# Patient Record
Sex: Female | Born: 1955 | Race: White | Hispanic: No | Smoking: Current every day smoker
Health system: Southern US, Community
[De-identification: ages and names within clinical notes are randomized; demographics above are authoritative.]

## PROBLEM LIST (undated history)

## (undated) DIAGNOSIS — R011 Cardiac murmur, unspecified: Secondary | ICD-10-CM

## (undated) DIAGNOSIS — M5020 Other cervical disc displacement, unspecified cervical region: Secondary | ICD-10-CM

## (undated) DIAGNOSIS — T7840XA Allergy, unspecified, initial encounter: Secondary | ICD-10-CM

## (undated) DIAGNOSIS — F329 Major depressive disorder, single episode, unspecified: Secondary | ICD-10-CM

## (undated) DIAGNOSIS — F32A Depression, unspecified: Secondary | ICD-10-CM

## (undated) DIAGNOSIS — M5412 Radiculopathy, cervical region: Secondary | ICD-10-CM

## (undated) DIAGNOSIS — F419 Anxiety disorder, unspecified: Secondary | ICD-10-CM

## (undated) DIAGNOSIS — R413 Other amnesia: Secondary | ICD-10-CM

## (undated) DIAGNOSIS — G90519 Complex regional pain syndrome I of unspecified upper limb: Secondary | ICD-10-CM

## (undated) HISTORY — PX: OTHER SURGICAL HISTORY: SHX169

## (undated) HISTORY — DX: Allergy, unspecified, initial encounter: T78.40XA

## (undated) HISTORY — DX: Radiculopathy, cervical region: M54.12

## (undated) HISTORY — DX: Cardiac murmur, unspecified: R01.1

## (undated) HISTORY — DX: Anxiety disorder, unspecified: F41.9

## (undated) HISTORY — PX: FRACTURE SURGERY: SHX138

## (undated) HISTORY — DX: Complex regional pain syndrome I of unspecified upper limb: G90.519

## (undated) HISTORY — PX: CARPAL TUNNEL RELEASE: SHX101

## (undated) HISTORY — DX: Major depressive disorder, single episode, unspecified: F32.9

## (undated) HISTORY — DX: Depression, unspecified: F32.A

## (undated) HISTORY — DX: Other amnesia: R41.3

---

## 1999-03-18 ENCOUNTER — Other Ambulatory Visit: Admission: RE | Admit: 1999-03-18 | Discharge: 1999-03-18 | Payer: Self-pay | Admitting: Obstetrics and Gynecology

## 1999-04-09 ENCOUNTER — Encounter: Payer: Self-pay | Admitting: Urology

## 1999-04-09 ENCOUNTER — Encounter (INDEPENDENT_AMBULATORY_CARE_PROVIDER_SITE_OTHER): Payer: Self-pay

## 1999-04-09 ENCOUNTER — Ambulatory Visit (HOSPITAL_COMMUNITY): Admission: RE | Admit: 1999-04-09 | Discharge: 1999-04-09 | Payer: Self-pay | Admitting: Urology

## 2000-04-06 ENCOUNTER — Other Ambulatory Visit: Admission: RE | Admit: 2000-04-06 | Discharge: 2000-04-06 | Payer: Self-pay | Admitting: Obstetrics and Gynecology

## 2001-04-26 ENCOUNTER — Other Ambulatory Visit: Admission: RE | Admit: 2001-04-26 | Discharge: 2001-04-26 | Payer: Self-pay | Admitting: Obstetrics and Gynecology

## 2001-11-01 ENCOUNTER — Encounter: Payer: Self-pay | Admitting: Family Medicine

## 2001-11-01 ENCOUNTER — Ambulatory Visit (HOSPITAL_COMMUNITY): Admission: RE | Admit: 2001-11-01 | Discharge: 2001-11-01 | Payer: Self-pay | Admitting: Family Medicine

## 2001-11-22 ENCOUNTER — Encounter: Payer: Self-pay | Admitting: Family Medicine

## 2001-11-22 ENCOUNTER — Ambulatory Visit (HOSPITAL_COMMUNITY): Admission: RE | Admit: 2001-11-22 | Discharge: 2001-11-22 | Payer: Self-pay | Admitting: Family Medicine

## 2001-12-08 ENCOUNTER — Other Ambulatory Visit: Admission: RE | Admit: 2001-12-08 | Discharge: 2001-12-08 | Payer: Self-pay | Admitting: Obstetrics and Gynecology

## 2002-04-27 ENCOUNTER — Other Ambulatory Visit: Admission: RE | Admit: 2002-04-27 | Discharge: 2002-04-27 | Payer: Self-pay | Admitting: Obstetrics and Gynecology

## 2002-11-03 ENCOUNTER — Other Ambulatory Visit: Admission: RE | Admit: 2002-11-03 | Discharge: 2002-11-03 | Payer: Self-pay | Admitting: Obstetrics and Gynecology

## 2003-05-03 ENCOUNTER — Other Ambulatory Visit: Admission: RE | Admit: 2003-05-03 | Discharge: 2003-05-03 | Payer: Self-pay | Admitting: Obstetrics and Gynecology

## 2003-05-10 ENCOUNTER — Encounter: Admission: RE | Admit: 2003-05-10 | Discharge: 2003-05-10 | Payer: Self-pay | Admitting: Obstetrics and Gynecology

## 2003-12-06 ENCOUNTER — Other Ambulatory Visit: Admission: RE | Admit: 2003-12-06 | Discharge: 2003-12-06 | Payer: Self-pay | Admitting: Obstetrics and Gynecology

## 2004-01-02 ENCOUNTER — Ambulatory Visit (HOSPITAL_COMMUNITY): Admission: RE | Admit: 2004-01-02 | Discharge: 2004-01-02 | Payer: Self-pay | Admitting: Family Medicine

## 2004-02-01 ENCOUNTER — Encounter: Admission: RE | Admit: 2004-02-01 | Discharge: 2004-02-01 | Payer: Self-pay | Admitting: Neurosurgery

## 2004-02-19 ENCOUNTER — Encounter: Admission: RE | Admit: 2004-02-19 | Discharge: 2004-02-19 | Payer: Self-pay | Admitting: Neurosurgery

## 2004-03-13 ENCOUNTER — Encounter (HOSPITAL_COMMUNITY): Admission: RE | Admit: 2004-03-13 | Discharge: 2004-04-12 | Payer: Self-pay | Admitting: Neurosurgery

## 2004-04-12 ENCOUNTER — Encounter (HOSPITAL_COMMUNITY): Admission: RE | Admit: 2004-04-12 | Discharge: 2004-05-12 | Payer: Self-pay | Admitting: Neurosurgery

## 2004-07-15 ENCOUNTER — Other Ambulatory Visit: Admission: RE | Admit: 2004-07-15 | Discharge: 2004-07-15 | Payer: Self-pay | Admitting: Obstetrics and Gynecology

## 2005-11-25 ENCOUNTER — Other Ambulatory Visit: Admission: RE | Admit: 2005-11-25 | Discharge: 2005-11-25 | Payer: Self-pay | Admitting: Obstetrics & Gynecology

## 2006-03-03 ENCOUNTER — Ambulatory Visit (HOSPITAL_BASED_OUTPATIENT_CLINIC_OR_DEPARTMENT_OTHER): Admission: RE | Admit: 2006-03-03 | Discharge: 2006-03-03 | Payer: Self-pay | Admitting: Urology

## 2006-12-17 ENCOUNTER — Other Ambulatory Visit: Admission: RE | Admit: 2006-12-17 | Discharge: 2006-12-17 | Payer: Self-pay | Admitting: Obstetrics and Gynecology

## 2007-03-29 ENCOUNTER — Emergency Department (HOSPITAL_COMMUNITY): Admission: EM | Admit: 2007-03-29 | Discharge: 2007-03-29 | Payer: Self-pay | Admitting: Emergency Medicine

## 2007-03-31 ENCOUNTER — Ambulatory Visit (HOSPITAL_BASED_OUTPATIENT_CLINIC_OR_DEPARTMENT_OTHER): Admission: RE | Admit: 2007-03-31 | Discharge: 2007-03-31 | Payer: Self-pay | Admitting: Orthopedic Surgery

## 2008-01-06 ENCOUNTER — Other Ambulatory Visit: Admission: RE | Admit: 2008-01-06 | Discharge: 2008-01-06 | Payer: Self-pay | Admitting: Obstetrics and Gynecology

## 2008-03-11 ENCOUNTER — Encounter: Admission: RE | Admit: 2008-03-11 | Discharge: 2008-03-11 | Payer: Self-pay | Admitting: Orthopedic Surgery

## 2008-03-20 ENCOUNTER — Encounter: Admission: RE | Admit: 2008-03-20 | Discharge: 2008-03-20 | Payer: Self-pay | Admitting: Obstetrics & Gynecology

## 2008-03-29 ENCOUNTER — Ambulatory Visit (HOSPITAL_COMMUNITY): Admission: RE | Admit: 2008-03-29 | Discharge: 2008-03-29 | Payer: Self-pay | Admitting: Orthopedic Surgery

## 2008-03-31 ENCOUNTER — Ambulatory Visit (HOSPITAL_COMMUNITY): Admission: RE | Admit: 2008-03-31 | Discharge: 2008-03-31 | Payer: Self-pay | Admitting: Orthopedic Surgery

## 2008-07-10 ENCOUNTER — Ambulatory Visit: Payer: Self-pay | Admitting: Pain Medicine

## 2009-03-23 ENCOUNTER — Encounter: Admission: RE | Admit: 2009-03-23 | Discharge: 2009-03-23 | Payer: Self-pay | Admitting: Obstetrics & Gynecology

## 2009-06-27 ENCOUNTER — Ambulatory Visit: Payer: Self-pay | Admitting: Psychology

## 2009-08-27 ENCOUNTER — Ambulatory Visit: Payer: Self-pay | Admitting: Psychology

## 2009-10-26 ENCOUNTER — Encounter: Admission: RE | Admit: 2009-10-26 | Discharge: 2009-10-26 | Payer: Self-pay | Admitting: Neurology

## 2009-11-19 ENCOUNTER — Encounter: Admission: RE | Admit: 2009-11-19 | Discharge: 2009-11-19 | Payer: Self-pay | Admitting: Neurology

## 2010-04-17 ENCOUNTER — Encounter
Admission: RE | Admit: 2010-04-17 | Discharge: 2010-04-17 | Payer: Self-pay | Source: Home / Self Care | Admitting: Obstetrics and Gynecology

## 2010-08-08 ENCOUNTER — Other Ambulatory Visit (HOSPITAL_COMMUNITY): Payer: Self-pay | Admitting: Neurology

## 2010-08-08 DIAGNOSIS — G90519 Complex regional pain syndrome I of unspecified upper limb: Secondary | ICD-10-CM

## 2010-08-16 ENCOUNTER — Encounter (HOSPITAL_COMMUNITY)
Admission: RE | Admit: 2010-08-16 | Discharge: 2010-08-16 | Disposition: A | Discharge status: Home / Self Care | Payer: 59 | Source: Ambulatory Visit | Attending: Neurology | Admitting: Neurology

## 2010-08-16 ENCOUNTER — Encounter (HOSPITAL_COMMUNITY): Payer: Self-pay

## 2010-08-16 DIAGNOSIS — G90519 Complex regional pain syndrome I of unspecified upper limb: Secondary | ICD-10-CM | POA: Insufficient documentation

## 2010-08-16 MED ORDER — TECHNETIUM TC 99M MEDRONATE IV KIT
23.8000 | PACK | Freq: Once | INTRAVENOUS | Status: AC | PRN
  Administered 2010-08-16: 23.8 via INTRAVENOUS

## 2010-09-24 NOTE — Consult Note (Signed)
NAME:  Jean Perez, Jean Perez                ACCOUNT NO.:  [id number]   MEDICAL RECORD NO.:  [id number]          PATIENT TYPE:  EMS   LOCATION:  ED                            FACILITY:  [facility]   PHYSICIAN:  J. Darreld Mclean, M.D. DATE OF BIRTH:  1956-01-24   DATE OF CONSULTATION:  DATE OF DISCHARGE:                                 CONSULTATION   REFERRING PHYSICIAN:  Patient seen at the request of Dr. Colon Branch.   The patient is a 55 year old female who slipped and fell on a step while  bringing her groceries up into her house.  She had an injury 11:30 at  night tonight.  She had immediate pain and deformity of her left arm and  elbow area.  She is brought to the emergency room.  X-rays reveal a  dislocation of the left elbow posteriorly, with what looked like  fragments off of the capitellum.  The patient is in pain.  She has  developed significant numbness to her fingers and some early  discoloration of the fingers, particularly on the ulnar side.  There are  no other injuries.   The patient has a history of cervical disk, and she says the numbness  and all feels like it did when she had her disk being active.  She has  no surgical history.  She is on no medications.  She has no allergies.  General health as very good.  The patient is accompanied by her  daughter.   The patient has received morphine 20 mg by the time I was called and  have arrived.  I explained the seriousness of the injury.  Because of  the fragment, she is going to need to be seen by the hand surgeon.  The  question is with reduction will the fragments enter the joint or not.  I  have explained that if they do, she will need surgery to have these  removed.  She will still need to see the hand surgeon because of the  fracture around the capitellum area.  The elbow, I explained, needed to  be reduced more as an emergency maneuver because of her marked  paresthesias and beginning some color changes to her hand.  She has  significant swelling also around the medial elbow area.  Pulses are  present.  There is no signs of a compartment syndrome by clinical exam  as best I can determine.  This is a concern, of course.   I explained the procedure and risks, and imponderables to the patient  and to her daughter.   It should be noted that the patient is left-hand dominant.  This is the  left elbow.   Consent was given for Versed and conscious sedation procedure for the  elbow.  They appeared to understand and agreed with the procedure.  Signed a form for the procedure.   The patient was given IV Versed 5 mg slowly by me.  After 4 mg she was  still talking.  After the 5th mg she became somnolent.  I did a general  closed reduction of the elbow.  It was not difficult to reduce.  Color  immediately returned properly back to the hand.  At the time an x-ray  was done, just a lateral view, the patient's color and temperature of  her hand returned to normal.  I had her move her fingers and she can  move them.  She was still somnolent from the Versed.  Initial reduction  looked good on the lateral.  I put a posterior splint on, and other  views are being taken now.  She will need to be seen by a hand surgeon  later today.  It is 2:45 in the morning, and  I have explained to the  patient's daughter that hand will need to see her concerning the  fracture of the capitellum and views, special views,  scan may need to be done.  The patient's sensation appears to be normal.  The patient's motion appears to be normal.  I told them if they had any  difficulty, come back to the emergency room later in the day.  Dr.  Colon Branch will make arrangements for the hand surgeon.  Will provide  outpatient pain medication.           ______________________________  Shela Commons. Darreld Mclean, M.D.     JWK/MEDQ  D:  03/29/2007  T:  03/29/2007  Job:  [id number]

## 2010-09-24 NOTE — Op Note (Signed)
NAME:  Jean Perez, Jean Perez                ACCOUNT NO.:  [id number]   MEDICAL RECORD NO.:  [id number]          PATIENT TYPE:  AMB   LOCATION:  [facility]                          FACILITY:  [facility]   PHYSICIAN:  Artist Pais. Weingold, M.D.DATE OF BIRTH:  01/27/1956   DATE OF PROCEDURE:  03/31/2007  DATE OF DISCHARGE:  03/31/2007                               OPERATIVE REPORT   PREOPERATIVE DIAGNOSIS:  Displaced fracture, left elbow, status post  dislocation.   POSTOPERATIVE DIAGNOSIS:  Displaced fracture, left elbow, status post  dislocation.   PROCEDURE:  Open reduction and internal fixation lateral condyle/capitellar  fracture, left elbow.   SURGEON:  Artist Pais. Mina Marble, M.D.  Cindee Salt, M.D.   ANESTHESIA:  General.   TOURNIQUET TIME:  50 minutes.   COMPLICATIONS:  None.   DRAINS:  None.   OPERATIVE REPORT:  The patient was taken to the operating suite. After  the induction of adequate general anesthesia, the left upper extremity  was prepped and draped in a sterile fashion.  An Esmarch was used to  exsanguinate the limb.  The tourniquet was inflated to 250 mmHg. At this  point in time, an incision was made, Kocher type, over the lateral  aspect of the elbow. The skin was incised for 5-6 cm.  The arm was  flexed 90 degrees.  Pronation/supination revealed crepitation of the  radiocapitellar joint.  The fascia overlying this area was incised and  dissection was carried down to the area over the radiocapitellar joint  where there was a rent in the common extensor tendon.  This was opened  proximally and distally revealing the radial head and capitellar  articulation. The radial head was intact.  There was a capitellar  fracture at the very lateral aspect which was complex in nature and  included a fair amount of the lateral condyle, as well.  The fracture  site was reduced and held with an 0.045 K-wire.  Once this was done, the  complex lateral condylar/capsular fracture was fixed with  three modular  handset 2.4 mm head screws, 18, 20, and 26 mm in length.  Intraoperative  fluoroscopy revealed adequate reduction in the AP, lateral, and oblique  views.  The wound was thoroughly irrigated and loosely closed in layers  of 0 Vicryl, 2-0 Vicryl and 3-0  Prolene subcuticular stitch on the skin.  Steri-Strips, 4x4s, fluffs and  posterior elbow splint was applied with the elbow flexed 90 degrees and  the forearm in neutral.  The patient tolerated the procedure well and  went to the recovery room in stable fashion.      Artist Pais Mina Marble, M.D.  Electronically Signed     MAW/MEDQ  D:  03/31/2007  T:  03/31/2007  Job:  [id number]

## 2010-09-27 NOTE — Op Note (Signed)
NAME:  Jean Perez, Jean Perez                ACCOUNT NO.:  [id number]   MEDICAL RECORD NO.:  [id number]          PATIENT TYPE:  AMB   LOCATION:  [facility]                         FACILITY:  [facility]   PHYSICIAN:  Jamison Neighbor, M.D.  DATE OF BIRTH:  [birth year]   DATE OF PROCEDURE:  03/03/2006  DATE OF DISCHARGE:                                 OPERATIVE REPORT   SERVICE:  Urology.   PREOPERATIVE DIAGNOSES:  1. Interstitial cystitis.  2. Urgency incontinence with associated voiding dysfunction.   POSTOPERATIVE DIAGNOSES:  1. Interstitial cystitis.  2. Urgency incontinence with associated voiding dysfunction.   PROCEDURE:  1. Cystoscopy.  2. Urethral calibration.  3. Hydrodistention of the bladder.  4. Marcaine and Pyridium installation.  5. Marcaine and Kenalog injection.   SURGEON:  Jamison Neighbor, M.D.   ANESTHESIA:  General.   COMPLICATIONS:  None.   DRAINS:  None.   BRIEF HISTORY:  This 55 year old female had a diagnosis of interstitial  cystitis made 6-7 years ago.  The patient had a cystoscopic examination at  that time that did not show significant glomerulations, but on biopsy, she  had significant mast cells in the bladder.  The patient had a great response  to hydrodistention and did well for 5 or more years.  Recently, however, she  has had increasing urgency and frequency as well as a problem with chronic  straining to move her bowels.  She also felt there was some degree of  incontinence.  Urodynamic study showed she had no evidence of stress  incontinence.  She did have some degree of urgency with hypersensitivity and  she also had some degree of elevated pelvic floor dysfunction.  The patient  is now to undergo cystoscopy and hydrodistention to reevaluate the bladder  and see if she can have the same kind of improvement she had 6 or more years  ago.  She is aware of the fact, however, that there is no guarantee she will  have the same improvement and she may need to go  back on medications which  could include Elmiron, antihistamines, or possibly muscle relaxants such as  Valium for her pelvic floor.  We also will consider her for Physical Therapy  evaluation.  She understands the risks and benefits of the procedure and  gave full informed consent.   PROCEDURE:  After successful induction of general anesthesia, the patient  was placed in the dorsal lithotomy position, prepped with Betadine and  draped in the usual sterile fashion.  Careful bimanual examination revealed  a normal urethra and an unremarkable bladder base.  There were no signs of a  cystocele or any vault prolapse.  There was no enterocele.  There did appear  to be a modest rectocele, but it was not felt this was a significant  finding.  The urethra was calibrated to 32-French with female urethral  sounds with no signs of stenosis or stricture.  The cystoscope was inserted  and the bladder was carefully inspected; it was free of any tumor or stones.  Both ureteral orifices were normal in configuration and location.  Hydrodistention of the bladder was then performed; the bladder was distended  at a pressure of 100 cmH2O for 5 minutes.  The bladder capacity has really  normalized; it is now at 1000 mL and very little in the way of  glomerulations could be identified; this is an improvement from what she had  the last time this was done and shows major improvement in the bladder.  A  biopsy was not performed.  There was an area of somewhat pronounced squamous  metaplasia that might be a source of hematuria, so that was cauterized, but  otherwise, nothing else needed to be done with the bladder.  The bladder was  drained and a mixture of Marcaine and Pyridium was left in the bladder.  A  mixture of Marcaine and Kenalog was injected periurethral.  The patient  tolerated the procedure well and was taken to the recovery room in good  condition.  She received intraoperative Toradol, Zofran and a  B&O  suppository.   She will return to the office in followup in 3 weeks' time.  She will be  sent home with Lorcet Plus, Pyridium Plus and doxycycline.  At that time, we  will assess results of the hydrodistention and decides whether she needs  interstitial cystitis-directed therapy, physical therapy or muscle relaxants  to try to improve her voiding parameters.           ______________________________  Jamison Neighbor, M.D.  Electronically Signed     RJE/MEDQ  D:  03/03/2006  T:  03/04/2006  Job:  [id number]   cc:   Edwena Felty. Romine, M.D.  Fax: [phone]

## 2010-09-27 NOTE — Procedures (Signed)
   NAME:  Jean Perez, Jean Perez NO.:  [id number]   MEDICAL RECORD NO.:  [id number]                  PATIENT TYPE:   LOCATION:                                       FACILITY:   PHYSICIAN:  Donna Bernard, M.D.             DATE OF BIRTH:   DATE OF PROCEDURE:  11/01/2001  DATE OF DISCHARGE:                                    STRESS TEST   INDICATIONS FOR TEST:  The patient is a 55 year old white female with a  history of atypical chest discomfort.  There is positive family history of  coronary artery disease.  The patient does not smoke.   TEST DATA:  Stress test was performed at standard Bruce protocol.  Resting  EKG revealed normal sinus rhythm with no significant ST-T changes.  The  patient tolerated the first two stages relatively well.  During the third  stage, the patient developed progressive tachypnea.  She had no chest pain  or discomfort.  At her maximum heart rate of 155, she surpassed her max  predicated heart rate of 150.  At 0.08 seconds past the J point, there were  no significant ST segment changes.  In some of the leads, the ST segment was  depressed; however, the slope was nicely ascending at 0.08 seconds past the  J point with less than 1 mm of actual depression except for in one single  lead in V4.   IMPRESSION:  Negative adequate stress test.   PLAN:  The patient reassured and encouraged to exercise.                                               Donna Bernard, M.D.    WSL/MEDQ  D:  04/23/2002  T:  04/24/2002  Job:  [id number]

## 2011-01-24 ENCOUNTER — Ambulatory Visit: Payer: 59 | Attending: Neurology | Admitting: Occupational Therapy

## 2011-01-24 DIAGNOSIS — IMO0001 Reserved for inherently not codable concepts without codable children: Secondary | ICD-10-CM | POA: Insufficient documentation

## 2011-01-24 DIAGNOSIS — R279 Unspecified lack of coordination: Secondary | ICD-10-CM | POA: Insufficient documentation

## 2011-01-24 DIAGNOSIS — M25649 Stiffness of unspecified hand, not elsewhere classified: Secondary | ICD-10-CM | POA: Insufficient documentation

## 2011-01-24 DIAGNOSIS — M2550 Pain in unspecified joint: Secondary | ICD-10-CM | POA: Insufficient documentation

## 2011-01-24 DIAGNOSIS — M6281 Muscle weakness (generalized): Secondary | ICD-10-CM | POA: Insufficient documentation

## 2011-01-27 ENCOUNTER — Ambulatory Visit: Payer: 59 | Admitting: Occupational Therapy

## 2011-01-30 ENCOUNTER — Ambulatory Visit: Payer: 59 | Admitting: Occupational Therapy

## 2011-02-03 ENCOUNTER — Ambulatory Visit: Payer: 59 | Admitting: Occupational Therapy

## 2011-02-06 ENCOUNTER — Ambulatory Visit: Payer: 59 | Admitting: Occupational Therapy

## 2011-02-10 ENCOUNTER — Ambulatory Visit: Payer: 59 | Attending: Neurology | Admitting: Occupational Therapy

## 2011-02-10 DIAGNOSIS — M6281 Muscle weakness (generalized): Secondary | ICD-10-CM | POA: Insufficient documentation

## 2011-02-10 DIAGNOSIS — IMO0001 Reserved for inherently not codable concepts without codable children: Secondary | ICD-10-CM | POA: Insufficient documentation

## 2011-02-10 DIAGNOSIS — M2550 Pain in unspecified joint: Secondary | ICD-10-CM | POA: Insufficient documentation

## 2011-02-10 DIAGNOSIS — M25649 Stiffness of unspecified hand, not elsewhere classified: Secondary | ICD-10-CM | POA: Insufficient documentation

## 2011-02-10 DIAGNOSIS — R279 Unspecified lack of coordination: Secondary | ICD-10-CM | POA: Insufficient documentation

## 2011-02-12 ENCOUNTER — Ambulatory Visit: Payer: 59 | Admitting: Occupational Therapy

## 2011-02-17 ENCOUNTER — Ambulatory Visit: Payer: 59 | Admitting: Occupational Therapy

## 2011-02-18 LAB — POCT HEMOGLOBIN-HEMACUE
Hemoglobin: 12.7
Operator id: 116011

## 2011-02-20 ENCOUNTER — Encounter: Payer: 59 | Admitting: Occupational Therapy

## 2011-03-19 ENCOUNTER — Other Ambulatory Visit: Payer: Self-pay | Admitting: Obstetrics and Gynecology

## 2011-03-19 DIAGNOSIS — Z1231 Encounter for screening mammogram for malignant neoplasm of breast: Secondary | ICD-10-CM

## 2011-04-21 ENCOUNTER — Ambulatory Visit
Admission: RE | Admit: 2011-04-21 | Discharge: 2011-04-21 | Disposition: A | Discharge status: Home / Self Care | Payer: 59 | Source: Ambulatory Visit | Attending: Obstetrics and Gynecology | Admitting: Obstetrics and Gynecology

## 2011-04-21 DIAGNOSIS — Z1231 Encounter for screening mammogram for malignant neoplasm of breast: Secondary | ICD-10-CM

## 2011-08-15 DIAGNOSIS — M5412 Radiculopathy, cervical region: Secondary | ICD-10-CM | POA: Diagnosis not present

## 2011-08-15 DIAGNOSIS — G5600 Carpal tunnel syndrome, unspecified upper limb: Secondary | ICD-10-CM | POA: Diagnosis not present

## 2011-08-15 DIAGNOSIS — R413 Other amnesia: Secondary | ICD-10-CM | POA: Diagnosis not present

## 2011-08-15 DIAGNOSIS — G90519 Complex regional pain syndrome I of unspecified upper limb: Secondary | ICD-10-CM | POA: Diagnosis not present

## 2011-08-15 DIAGNOSIS — F329 Major depressive disorder, single episode, unspecified: Secondary | ICD-10-CM | POA: Diagnosis not present

## 2011-12-16 DIAGNOSIS — G90519 Complex regional pain syndrome I of unspecified upper limb: Secondary | ICD-10-CM | POA: Diagnosis not present

## 2011-12-16 DIAGNOSIS — G5600 Carpal tunnel syndrome, unspecified upper limb: Secondary | ICD-10-CM | POA: Diagnosis not present

## 2012-01-07 DIAGNOSIS — G5600 Carpal tunnel syndrome, unspecified upper limb: Secondary | ICD-10-CM | POA: Diagnosis not present

## 2012-03-12 ENCOUNTER — Other Ambulatory Visit: Payer: Self-pay | Admitting: Obstetrics and Gynecology

## 2012-03-12 DIAGNOSIS — Z1231 Encounter for screening mammogram for malignant neoplasm of breast: Secondary | ICD-10-CM

## 2012-03-13 DIAGNOSIS — Z23 Encounter for immunization: Secondary | ICD-10-CM | POA: Diagnosis not present

## 2012-03-17 ENCOUNTER — Other Ambulatory Visit: Payer: Self-pay | Admitting: Nurse Practitioner

## 2012-03-17 DIAGNOSIS — E559 Vitamin D deficiency, unspecified: Secondary | ICD-10-CM | POA: Diagnosis not present

## 2012-03-17 DIAGNOSIS — Z01419 Encounter for gynecological examination (general) (routine) without abnormal findings: Secondary | ICD-10-CM | POA: Diagnosis not present

## 2012-03-17 DIAGNOSIS — Z124 Encounter for screening for malignant neoplasm of cervix: Secondary | ICD-10-CM | POA: Diagnosis not present

## 2012-03-17 DIAGNOSIS — Z0000 Encounter for general adult medical examination without abnormal findings: Secondary | ICD-10-CM | POA: Diagnosis not present

## 2012-03-17 DIAGNOSIS — E7800 Pure hypercholesterolemia, unspecified: Secondary | ICD-10-CM | POA: Diagnosis not present

## 2012-03-17 DIAGNOSIS — N951 Menopausal and female climacteric states: Secondary | ICD-10-CM

## 2012-03-24 DIAGNOSIS — E785 Hyperlipidemia, unspecified: Secondary | ICD-10-CM | POA: Diagnosis not present

## 2012-04-16 ENCOUNTER — Emergency Department (HOSPITAL_COMMUNITY): Payer: No Typology Code available for payment source

## 2012-04-16 ENCOUNTER — Emergency Department (HOSPITAL_COMMUNITY)
Admission: EM | Admit: 2012-04-16 | Discharge: 2012-04-16 | Disposition: A | Discharge status: Home / Self Care | Payer: No Typology Code available for payment source | Attending: Emergency Medicine | Admitting: Emergency Medicine

## 2012-04-16 ENCOUNTER — Encounter (HOSPITAL_COMMUNITY): Payer: Self-pay | Admitting: *Deleted

## 2012-04-16 DIAGNOSIS — M5020 Other cervical disc displacement, unspecified cervical region: Secondary | ICD-10-CM | POA: Insufficient documentation

## 2012-04-16 DIAGNOSIS — IMO0002 Reserved for concepts with insufficient information to code with codable children: Secondary | ICD-10-CM | POA: Diagnosis not present

## 2012-04-16 DIAGNOSIS — Z23 Encounter for immunization: Secondary | ICD-10-CM | POA: Insufficient documentation

## 2012-04-16 DIAGNOSIS — F17200 Nicotine dependence, unspecified, uncomplicated: Secondary | ICD-10-CM | POA: Insufficient documentation

## 2012-04-16 DIAGNOSIS — Z79899 Other long term (current) drug therapy: Secondary | ICD-10-CM | POA: Insufficient documentation

## 2012-04-16 DIAGNOSIS — S0081XA Abrasion of other part of head, initial encounter: Secondary | ICD-10-CM

## 2012-04-16 DIAGNOSIS — F411 Generalized anxiety disorder: Secondary | ICD-10-CM | POA: Diagnosis not present

## 2012-04-16 DIAGNOSIS — Y9389 Activity, other specified: Secondary | ICD-10-CM | POA: Insufficient documentation

## 2012-04-16 DIAGNOSIS — S20219A Contusion of unspecified front wall of thorax, initial encounter: Secondary | ICD-10-CM | POA: Insufficient documentation

## 2012-04-16 DIAGNOSIS — M5412 Radiculopathy, cervical region: Secondary | ICD-10-CM | POA: Diagnosis not present

## 2012-04-16 HISTORY — DX: Other cervical disc displacement, unspecified cervical region: M50.20

## 2012-04-16 MED ORDER — TETANUS-DIPHTH-ACELL PERTUSSIS 5-2.5-18.5 LF-MCG/0.5 IM SUSP
0.5000 mL | Freq: Once | INTRAMUSCULAR | Status: DC
  Filled 2012-04-16: qty 0.5

## 2012-04-16 MED ORDER — HYDROCODONE-ACETAMINOPHEN 5-500 MG PO TABS: 1.0000 | ORAL_TABLET | Freq: Four times a day (QID) | ORAL | Status: DC | PRN

## 2012-04-16 MED ORDER — HYDROCODONE-ACETAMINOPHEN 5-325 MG PO TABS
1.0000 | ORAL_TABLET | Freq: Once | ORAL | Status: AC
  Administered 2012-04-16: 1 via ORAL
  Filled 2012-04-16: qty 1

## 2012-04-16 NOTE — ED Notes (Signed)
Patient given discharge instructions, information, prescriptions, and diet order. Patient states that they adequately understand discharge information given and to return to ED if symptoms return or worsen.     
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
## 2012-04-16 NOTE — ED Notes (Signed)
Pt refused bloodwork - states "i feel fine and im ready to go home."  RN aware.
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
## 2012-04-26 ENCOUNTER — Ambulatory Visit: Payer: 59

## 2012-04-26 ENCOUNTER — Other Ambulatory Visit: Payer: 59

## 2012-10-06 ENCOUNTER — Telehealth: Payer: Self-pay | Admitting: Neurology

## 2012-10-13 ENCOUNTER — Telehealth: Payer: Self-pay | Admitting: Neurology

## 2012-10-13 NOTE — Telephone Encounter (Signed)
Patient will be assigned a doctor and called for a appointment.

## 2012-11-21 ENCOUNTER — Other Ambulatory Visit: Payer: Self-pay | Admitting: Neurology

## 2013-02-10 ENCOUNTER — Encounter: Payer: Self-pay | Admitting: Neurology

## 2013-02-10 ENCOUNTER — Ambulatory Visit (INDEPENDENT_AMBULATORY_CARE_PROVIDER_SITE_OTHER): Payer: Medicare Other | Admitting: Neurology

## 2013-02-10 VITALS — BP 115/80 | HR 73 | Temp 99.1°F | Ht 59.0 in | Wt 101.0 lb

## 2013-02-10 DIAGNOSIS — M79609 Pain in unspecified limb: Secondary | ICD-10-CM

## 2013-02-10 DIAGNOSIS — M79602 Pain in left arm: Secondary | ICD-10-CM

## 2013-02-10 MED ORDER — LIDOCAINE 5 % EX PTCH: 1.0000 | MEDICATED_PATCH | CUTANEOUS | Status: DC

## 2013-02-10 MED ORDER — GABAPENTIN 400 MG PO CAPS: 400.0000 mg | ORAL_CAPSULE | Freq: Two times a day (BID) | ORAL | Status: DC

## 2013-02-10 MED ORDER — CITALOPRAM HYDROBROMIDE 20 MG PO TABS: 20.0000 mg | ORAL_TABLET | Freq: Every day | ORAL | Status: DC

## 2013-02-10 MED ORDER — OXYCODONE-ACETAMINOPHEN 5-325 MG PO TABS: 1.0000 | ORAL_TABLET | ORAL | Status: DC | PRN

## 2013-02-10 NOTE — Progress Notes (Addendum)
[facility]  PATIENT: Jean Perez DOB: [birth year]  HISTORICAL  Jean Perez is a 57 years old left-handed Caucasian female, patient of Dr. Sandria Manly, last clinical visit was in December 2013, she is here to followup of her left arm complex regional pain syndrome  She suffered a fall, and injury to her left elbow in March 28 2007, had a surgery by Dr. Mina Marble in March 31 2007, complicated by pain in her left arm , she had 14  Stellate ganglion block for complex regional syndrome, also had left carpal tunnel release surgery in 2009, she subsequently had surgery at [facility] August 04 2008 with a left lateral condyle removal of the hardware, debridement of non-union, as well as the lateral collateral ligament reconstruction with allograft, and the left ulnar nerve decompression, despite all the effort, she continued to have left arm pain, from left elbow down, numbness tingling burning throbbing sensation, constant  She complains altered sensation in the left thumb index third and half of the fourth finger, cannot feel objects such as clothing from the dryer to determine if this is dry or wet  MRI study of the brain February 2011 showed mild atrophy Laboratory evaluation showed  normal B12, TSH, RPR, ESR   She had neuropsychiatric study in June 27 2009, August 24 2009, there was no cognitive deficit, it was felt that her anxiety, pain, and side effect of the pain medication causing her memory trouble  She is independent of daily living, driving a car,  EMG nerve conduction study in Sep 19 2009 showed changes of denervation, and reinnervation at left C7, MRI of the cervical in June 2011 showed mild left foraminal stenosis due to a small left posterior lateral disc protrusion at C6-7, and epidural steroid injection which increases stiffness in her neck,  She is now using compounding cream to her left arm, lidocaine patches, she also suffered depression and anxiety, is taking  gabapentin 400 mg twice a day, citalopram 20 mg once a day,  REVIEW OF SYSTEMS: Full 14 system review of systems performed and notable only for fatigue, swelling in legs, blurred vision, cough, constipation, urination problem, joint pain, joint swelling, achy muscles, energy, memory loss, confusion, headaches, numbness, weakness, dizziness, insomnia, sleepiness, depression, anxiety, not enough sleep, decreased energy, disinterested in activities, racing thoughts.  ALLERGIES: Allergies  Allergen Reactions  . Penicillins     HOME MEDICATIONS: Outpatient Prescriptions Prior to Visit  Medication Sig Dispense Refill  . calcium-vitamin D (OSCAL WITH D) 500-200 MG-UNIT per tablet Take 1 tablet by mouth daily.      . naproxen sodium (ALEVE) 220 MG tablet Take 220 mg by mouth 2 (two) times daily with a meal. PAIN      . PRESCRIPTION MEDICATION PT GETS A COMPOUND PRESCRIPTION AT CUSTOM CARE. KETAMINE 10% LIDOCAINE 5% AMITRIPALINE 2%      . Vitamin D, Ergocalciferol, (DRISDOL) 50000 UNITS CAPS Take 50,000 Units by mouth every 7 (seven) days.      . citalopram (CELEXA) 20 MG tablet TAKE 1 TABLET BY MOUTH DAILY  30 tablet  3  . gabapentin (NEURONTIN) 400 MG capsule TAKE 1 CAPSULE BY MOUTH TWICE DAILY  60 capsule  3  . HYDROcodone-acetaminophen (VICODIN) 5-500 MG per tablet Take 1-2 tablets by mouth every 6 (six) hours as needed for pain.  20 tablet  0  . lidocaine (LIDODERM) 5 % Place 1 patch onto the skin daily. Remove & Discard patch within 12 hours or as directed by MD  No facility-administered medications prior to visit.    PAST MEDICAL HISTORY: Past Medical History  Diagnosis Date  . Herniated cervical disc   . Memory loss   . Brachial neuritis or radiculitis NOS   . 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 Depression     PAST SURGICAL HISTORY: Past Surgical History  Procedure Laterality Date  . Arm surgery    . Left elbow    . Carpal tunnel release      FAMILY  HISTORY: Family History  Problem Relation Age of Onset  . Stroke    . Heart disease    . High blood pressure      SOCIAL HISTORY:  History   Social History  . Marital Status: Single    Spouse Name: N/A    Number of Children: 2  . Years of Education: HS   Occupational History  .      disabled   Social History Main Topics  . Smoking status: Current Every Day Smoker  . Smokeless tobacco: Never Used  . Alcohol Use: No  . Drug Use: No  . Sexual Activity: Not on file   Other Topics Concern  . Not on file   Social History Narrative   Patient is disabled. And she is divorced.    Caffeine one pot of coffee daily.   Left handed.     PHYSICAL EXAM   Filed Vitals:   02/10/13 1137  BP: 115/80  Pulse: 73  Temp: 99.1 F (37.3 C)  TempSrc: Oral  Height: 4\' 11"  (1.499 m)  Weight: 101 lb (45.813 kg)   Body mass index is 20.39 kg/(m^2).   Generalized: In no acute distress  Neck: Supple, no carotid bruits   Cardiac: Regular rate rhythm  Pulmonary: Clear to auscultation bilaterally  Musculoskeletal: No deformity  Neurological examination  Mentation: Alert oriented to time, place, history taking, and causual conversation  Cranial nerve II-XII: Pupils were equal round reactive to light extraocular movements were full, visual field were full on confrontational test. facial sensation and strength were normal. hearing was intact to finger rubbing bilaterally. Uvula tongue midline.  head turning and shoulder shrug and were normal and symmetric.Tongue protrusion into cheek strength was normal.  Motor: much less spontaneous movement of the left arm, there was a well-healed left elbow scar, no significant weakness, she has skin hypersensitivity at left elbow, left forearm there was no significant weakness,    Sensory: Intact to pinprick, preserved vibratory sensation, and proprioception at toes.  Coordination: Normal finger to nose, heel-to-shin bilaterally there was no  truncal ataxia  Gait: Rising up from seated position without assistance, normal stance, without trunk ataxia, moderate stride, good arm swing, smooth turning, able to perform tiptoe, and heel walking without difficulty.   Romberg signs: Negative  Deep tendon reflexes: Brachioradialis 2/2, biceps 2/2, triceps 2/2, patellar 2/2, Achilles 2/2, plantar responses were flexor bilaterally.   DIAGNOSTIC DATA (LABS, IMAGING, TESTING) - I reviewed patient records, labs, notes, testing and imaging myself where available.  Lab Results  Component Value Date   HGB 12.7 03/31/2007    ASSESSMENT AND PLAN   57 years old left-handed Caucasian female, with past medical history of left elbow surgery, developed complex regional pain syndrome,   1 I have refilled her medications, including Celexa 20 mg every day, gabapentin 400 mg twice a day, hydrocodone, naproxen 2. return to clinic in one year,     Levert Feinstein, M.D. Ph.D.  [facility]  [address], [place], [place] [address] [phone]

## 2013-02-23 DIAGNOSIS — Z23 Encounter for immunization: Secondary | ICD-10-CM | POA: Diagnosis not present

## 2013-03-18 ENCOUNTER — Ambulatory Visit: Payer: Self-pay | Admitting: Nurse Practitioner

## 2013-04-03 ENCOUNTER — Other Ambulatory Visit: Payer: Self-pay | Admitting: Neurology

## 2014-02-10 ENCOUNTER — Other Ambulatory Visit: Payer: Self-pay | Admitting: Neurology

## 2014-02-10 ENCOUNTER — Encounter (INDEPENDENT_AMBULATORY_CARE_PROVIDER_SITE_OTHER): Payer: Self-pay

## 2014-02-10 ENCOUNTER — Ambulatory Visit (INDEPENDENT_AMBULATORY_CARE_PROVIDER_SITE_OTHER): Payer: Medicare Other | Admitting: Neurology

## 2014-02-10 ENCOUNTER — Encounter: Payer: Self-pay | Admitting: Neurology

## 2014-02-10 VITALS — BP 111/78 | HR 70 | Ht 59.0 in | Wt 97.0 lb

## 2014-02-10 DIAGNOSIS — M79602 Pain in left arm: Secondary | ICD-10-CM

## 2014-02-10 MED ORDER — OXYCODONE-ACETAMINOPHEN 5-325 MG PO TABS: 1.0000 | ORAL_TABLET | ORAL | Status: DC | PRN

## 2014-02-10 MED ORDER — GABAPENTIN 400 MG PO CAPS: 400.0000 mg | ORAL_CAPSULE | Freq: Two times a day (BID) | ORAL | Status: DC

## 2014-02-10 MED ORDER — CITALOPRAM HYDROBROMIDE 20 MG PO TABS: 20.0000 mg | ORAL_TABLET | Freq: Every day | ORAL | Status: DC

## 2014-02-10 MED ORDER — LIDOCAINE 5 % EX PTCH: 1.0000 | MEDICATED_PATCH | CUTANEOUS | Status: DC

## 2014-02-10 NOTE — Progress Notes (Addendum)
[facility]  PATIENT: Jean Perez DOB: [birth year]  HISTORICAL  Jean Perez is a 58 years old left-handed Caucasian female, patient of Dr. Erling Cruz, last clinical visit was in December 2013, she is here to followup of her left arm complex regional pain syndrome  She suffered a fall, and injury to her left elbow in March 28 2007, had a surgery by Dr. Burney Gauze in March 30 1517, complicated by pain in her left arm , she had 14  Stellate ganglion block for complex regional syndrome, also had left carpal tunnel release surgery in 2009, she subsequently had surgery at [facility] in August 04 2008 with a left lateral condyle removal of the hardware, debridement of non-union, as well as the lateral collateral ligament reconstruction with allograft, and the left ulnar nerve decompression, despite all the effort, she continued to have left arm pain, from left elbow down, numbness tingling burning throbbing sensation, constant  She complains altered sensation in the left thumb index third and half of the fourth finger, cannot feel objects such as clothing from the dryer to determine if this is dry or wet  MRI study of the brain February 2011 showed mild atrophy Laboratory evaluation showed  normal B12, TSH, RPR, ESR   She had neuropsychiatric study in June 27 2009, August 24 2009, there was no cognitive deficit, it was felt that her anxiety, pain, and side effect of the pain medication causing her memory trouble  She is independent of daily living, driving a car,  EMG nerve conduction study in Sep 19 2009 showed changes of denervation, and reinnervation at left C7  MRI of the cervical in June 2011 showed mild left foraminal stenosis due to a small left posterior lateral disc protrusion at C6-7, and epidural steroid injection which increases stiffness in her neck,  She is now using compounding cream to her left arm, lidocaine patches, she also suffered depression and anxiety, is taking  gabapentin 400 mg twice a day, citalopram 20 mg once a day,  UPDATE Oct 2nd 2015: She is now on disability due to her left elbow injury, she denies gait difficulty, continuing to complains of left elbow discomfort, paresthesia, also frequent left shoulder blade area deep achy pain,  REVIEW OF SYSTEMS: Full 14 system review of systems performed and notable only for fatigue, left shoulder pain, eye pain, blurry vision, constipation, insomnia, frequent awakening, daytime sleepiness, frequent urination, joint pain, swelling, achy muscles, neck pain, stiffness, memory loss, headaches, weakness, confusion, decreased concentration, depression, anxiety  ALLERGIES: Allergies  Allergen Reactions  . Penicillins Hives, Other (See Comments) and Rash    Other Reaction: BRONCHOSPASM    HOME MEDICATIONS: Outpatient Prescriptions Prior to Visit  Medication Sig Dispense Refill  . calcium-vitamin D (OSCAL WITH D) 500-200 MG-UNIT per tablet Take 1 tablet by mouth daily.      . citalopram (CELEXA) 20 MG tablet TAKE 1 TABLET BY MOUTH EVERY DAY  30 tablet  0  . gabapentin (NEURONTIN) 400 MG capsule TAKE 1 CAPSULE BY MOUTH TWICE DAILY  60 capsule  0  . lidocaine (LIDODERM) 5 % Place 1 patch onto the skin daily. Remove & Discard patch within 12 hours or as directed by MD  30 patch  12  . naproxen sodium (ALEVE) 220 MG tablet Take 220 mg by mouth 2 (two) times daily with a meal. PAIN      . oxyCODONE-acetaminophen (PERCOCET/ROXICET) 5-325 MG per tablet Take 1 tablet by mouth every 4 (four) hours as needed  for pain.  30 tablet  0  . PRESCRIPTION MEDICATION PT GETS A COMPOUND PRESCRIPTION AT CUSTOM CARE. KETAMINE 10% LIDOCAINE 5% AMITRIPALINE 2%      . Vitamin D, Ergocalciferol, (DRISDOL) 50000 UNITS CAPS Take 50,000 Units by mouth every 7 (seven) days.       No facility-administered medications prior to visit.    PAST MEDICAL HISTORY: Past Medical History  Diagnosis Date  . Herniated cervical disc   . Memory  loss   . Brachial neuritis or radiculitis NOS   . 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 Depression     PAST SURGICAL HISTORY: Past Surgical History  Procedure Laterality Date  . Arm surgery    . Left elbow    . Carpal tunnel release      FAMILY HISTORY: Family History  Problem Relation Age of Onset  . Stroke    . Heart disease    . High blood pressure      SOCIAL HISTORY:  History   Social History  . Marital Status: Single    Spouse Name: N/A    Number of Children: 2  . Years of Education: HS   Occupational History  .      disabled   Social History Main Topics  . Smoking status: Current Every Day Smoker  . Smokeless tobacco: Never Used  . Alcohol Use: No  . Drug Use: No  . Sexual Activity: Not on file   Other Topics Concern  . Not on file   Social History Narrative   Patient is disabled. And she is divorced.    Caffeine one pot of coffee daily.   Left handed.     PHYSICAL EXAM   Filed Vitals:   02/10/14 1128  BP: 111/78  Pulse: 70  Height: _0  (1.499 m)  Weight: 97 lb (43.999 kg)   Body mass index is 19.58 kg/(m^2).   Generalized: In no acute distress  Neck: Supple, no carotid bruits   Cardiac: Regular rate rhythm  Pulmonary: Clear to auscultation bilaterally  Musculoskeletal: No deformity  Neurological examination  Mentation: Alert oriented to time, place, history taking, and causual conversation  Cranial nerve II-XII: Pupils were equal round reactive to light extraocular movements were full, visual field were full on confrontational test. facial sensation and strength were normal. hearing was intact to finger rubbing bilaterally. Uvula tongue midline.  head turning and shoulder shrug and were normal and symmetric.Tongue protrusion into cheek strength was normal.  Motor: much less spontaneous movement of the left arm, there was a well-healed left elbow scar, no significant weakness, she has skin hypersensitivity at left  elbow, left median forearm, tenderness of left levator scapular  Sensory: Intact to pinprick, preserved vibratory sensation, and proprioception at toes.  Coordination: Normal finger to nose, heel-to-shin bilaterally there was no truncal ataxia  Gait: Rising up from seated position without assistance, normal stance, without trunk ataxia, moderate stride, good arm swing, smooth turning, able to perform tiptoe, and heel walking without difficulty.   Romberg signs: Negative  Deep tendon reflexes: Brachioradialis 2/2, biceps 2/2, triceps 2/2, patellar 2/2, Achilles 2/2, plantar responses were flexor bilaterally.   DIAGNOSTIC DATA (LABS, IMAGING, TESTING) - I reviewed patient records, labs, notes, testing and imaging myself where available.  Lab Results  Component Value Date   HGB 12.7 03/31/2007    ASSESSMENT AND PLAN   58 years old left-handed Caucasian female, with past medical history of left elbow surgery, developed complex regional  pain syndrome,   1 I have refilled her medications, including Celexa 20 mg every day, gabapentin 400 mg twice a day, hydrocodone when necessary, lidocaine patch,  2. return to clinic in one year with nurse practitioner    Marcial Pacas, M.D. Ph.D.  [facility] [address] [place], [place] [address] [phone]

## 2014-02-16 ENCOUNTER — Telehealth: Payer: Self-pay

## 2014-02-16 NOTE — Telephone Encounter (Signed)
[employer] notified us they have approved our request for coverage on Lidocaine Patches effective until 08/12/2014 Ref # [id number]

## 2014-05-17 ENCOUNTER — Telehealth: Payer: Self-pay | Admitting: *Deleted

## 2014-05-17 NOTE — Telephone Encounter (Signed)
Patient states she had sternum chest pain. It comes and goes since Christmas Eve. She has been under a lot of stress. There is no rhyme or rhythm to the pain. No SOB. Pt said it helps ease off when she presses a pillow against her chest. Per Dr. Brett CanalesSteve, we will see her tomorrow. I told her if the pain gets worst, SOB, or numbness or tingling, to go to ER. Pt verbalized understanding.

## 2014-05-18 ENCOUNTER — Ambulatory Visit (INDEPENDENT_AMBULATORY_CARE_PROVIDER_SITE_OTHER): Payer: Medicare Other | Admitting: Family Medicine

## 2014-05-18 ENCOUNTER — Encounter: Payer: Self-pay | Admitting: Family Medicine

## 2014-05-18 VITALS — BP 120/72 | Wt 99.7 lb

## 2014-05-18 DIAGNOSIS — R079 Chest pain, unspecified: Secondary | ICD-10-CM | POA: Diagnosis not present

## 2014-05-18 MED ORDER — ETODOLAC 400 MG PO TABS: 400.0000 mg | ORAL_TABLET | Freq: Two times a day (BID) | ORAL | Status: DC

## 2014-05-18 NOTE — Progress Notes (Signed)
   Subjective:    Patient ID: Jean Perez, female    DOB: 01/26/1956, 59 y.o.   MRN: [id number]  HPI Patient arrives with complaint of spells of chest pain that comes and goes feels better if she presses a pillow against it. Patient also states she has been under a lot of stress lately and it started when she over ate at christmas. Sometimes it hurts for her to take deep breaths.  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 Followed by neurologist for this.  Pain not associated with exertion or exercise.  Review of Systems No vomiting no diarrhea no headache no back pain. No abdominal symptoms.    Objective:   Physical Exam  Alert active no acute distress. HEENT normal. Lungs clear. Heart regular in rhythm. Abdominal exam benign. Distinct sternal tenderness to palpation lower sternum and xiphoid process region.  EKG normal sinus rhythm no significant ST-T changes    Assessment & Plan:  Impression chest wall pain discussed at great length. Does not need GI/pulmonary/cardiology workup at this time. Inflammatory pain nature discussed. Plan trial of anti-inflammatories. Local measures discussed. Recheck her persists. WSL

## 2014-05-29 ENCOUNTER — Encounter: Payer: Self-pay | Admitting: Family Medicine

## 2014-06-01 ENCOUNTER — Ambulatory Visit: Payer: Medicare Other | Admitting: Family Medicine

## 2014-09-27 ENCOUNTER — Telehealth: Payer: Self-pay | Admitting: *Deleted

## 2014-09-27 NOTE — Telephone Encounter (Signed)
Left voicemail message for patient to call the office

## 2014-09-27 NOTE — Telephone Encounter (Signed)
Spoke to pt on the phone she has a question about her medical records.

## 2014-09-27 NOTE — Telephone Encounter (Signed)
Called patient about the requested amendment to her medical records.  Left a message for her to call back to discuss.

## 2014-09-28 NOTE — Telephone Encounter (Signed)
She needs her records changed from being right-handed to left-handed.

## 2014-11-30 ENCOUNTER — Ambulatory Visit (INDEPENDENT_AMBULATORY_CARE_PROVIDER_SITE_OTHER): Payer: Medicare Other | Admitting: Family Medicine

## 2014-11-30 ENCOUNTER — Encounter: Payer: Self-pay | Admitting: Family Medicine

## 2014-11-30 VITALS — Wt 95.8 lb

## 2014-11-30 DIAGNOSIS — T148 Other injury of unspecified body region: Secondary | ICD-10-CM | POA: Diagnosis not present

## 2014-11-30 DIAGNOSIS — W57XXXA Bitten or stung by nonvenomous insect and other nonvenomous arthropods, initial encounter: Secondary | ICD-10-CM

## 2014-11-30 MED ORDER — HYDROXYZINE HCL 25 MG PO TABS: 25.0000 mg | ORAL_TABLET | ORAL | Status: DC | PRN

## 2014-11-30 MED ORDER — PREDNISONE 20 MG PO TABS: ORAL_TABLET | ORAL | Status: DC

## 2014-11-30 MED ORDER — METHYLPREDNISOLONE ACETATE 40 MG/ML IJ SUSP
40.0000 mg | Freq: Once | INTRAMUSCULAR | Status: AC
  Administered 2014-11-30: 40 mg via INTRAMUSCULAR

## 2014-11-30 NOTE — Progress Notes (Signed)
   Subjective:    Patient ID: Jean Perez, female    DOB: November 25, 1955, 58 y.o.   MRN: [id number]  HPI Patient with extensive rash of the abdomen groin but not in chest. This is getting worse itching intensely started this past weekend no new medicines or soaps or detergents. PMH benign.   Review of Systems     Objective:   Physical Exam  Multiple bumps on abdomen chest lower groin all of these have the appearance of multiple bug bites.      Assessment & Plan:  Significant bug bites I recommend prednisone taper along with sterile shot warning signs were discussed. I believe these were due to straw mites

## 2014-11-30 NOTE — Progress Notes (Signed)
   Subjective:    Patient ID: Jean Perez, female    DOB: 1956-02-11, 59 y.o.   MRN: [id number]  HPI Patient arrives with c/o itchy rash since sat.   Review of Systems     Objective:   Physical Exam        Assessment & Plan:

## 2015-02-13 ENCOUNTER — Ambulatory Visit (INDEPENDENT_AMBULATORY_CARE_PROVIDER_SITE_OTHER): Payer: Medicare Other | Admitting: Nurse Practitioner

## 2015-02-13 ENCOUNTER — Encounter: Payer: Self-pay | Admitting: Nurse Practitioner

## 2015-02-13 VITALS — BP 113/78 | HR 77 | Ht 59.0 in | Wt 98.8 lb

## 2015-02-13 DIAGNOSIS — M79602 Pain in left arm: Secondary | ICD-10-CM

## 2015-02-13 MED ORDER — GABAPENTIN 400 MG PO CAPS: 400.0000 mg | ORAL_CAPSULE | Freq: Two times a day (BID) | ORAL | Status: DC

## 2015-02-13 MED ORDER — CITALOPRAM HYDROBROMIDE 20 MG PO TABS: 20.0000 mg | ORAL_TABLET | Freq: Every day | ORAL | Status: DC

## 2015-02-13 NOTE — Patient Instructions (Signed)
Patient to continue Celexa at current dose will refill Continue gabapentin at current dose will refill Follow-up yearly and when necessary

## 2015-02-13 NOTE — Progress Notes (Signed)
I have reviewed and agreed above plan. 

## 2015-02-13 NOTE — Progress Notes (Signed)
[facility]  PATIENT: Jean Perez DOB: 1955/05/15   REASON FOR VISIT: Follow-up for left elbow pain from previous injury and surgery  HISTORY FROM:Patient    HISTORY OF PRESENT ILLNESS: [name], 59 year old female returns for follow-up. She was last seen in this office by Dr. Krista Blue 02/10/2014. She has a history of left elbow injury and surgery. She is  on disability due to her left elbow injury, she denies gait difficulty, continuing to complains of left elbow discomfort, paresthesia, also frequent left shoulder blade area deep achy pain, her pain for the most part is controlled with gabapentin however she does take an occasional Percocet. She also occasionally wears an elbow brace. She returns for reevaluation    HISTORY: [name] is a 59 years old left-handed Caucasian female, patient of Dr. Erling Cruz, last clinical visit was in December 2013, she is here to followup of her left arm complex regional pain syndrome  She suffered a fall, and injury to her left elbow in March 28 2007, had a surgery by Dr. Burney Gauze in March 30 8526, complicated by pain in her left arm , she had 14 Stellate ganglion block for complex regional syndrome, also had left carpal tunnel release surgery in 2009, she subsequently had surgery at [facility] in August 04 2008 with a left lateral condyle removal of the hardware, debridement of non-union, as well as the lateral collateral ligament reconstruction with allograft, and the left ulnar nerve decompression, despite all the effort, she continued to have left arm pain, from left elbow down, numbness tingling burning throbbing sensation, constant She complains altered sensation in the left thumb index third and half of the fourth finger, cannot feel objects such as clothing from the dryer to determine if this is dry or wet MRI study of the brain February 2011 showed mild atrophy Laboratory evaluation showed normal B12, TSH, RPR, ESR  She had  neuropsychiatric study in June 27 2009, August 24 2009, there was no cognitive deficit, it was felt that her anxiety, pain, and side effect of the pain medication causing her memory trouble She is independent of daily living, driving a car, EMG nerve conduction study in Sep 19 2009 showed changes of denervation, and reinnervation at left C7 MRI of the cervical in June 2011 showed mild left foraminal stenosis due to a small left posterior lateral disc protrusion at C6-7, and epidural steroid injection which increases stiffness in her neck, She is now using compounding cream to her left arm, lidocaine patches, she also suffered depression and anxiety, is taking gabapentin 400 mg twice a day, citalopram 20 mg once a day,   REVIEW OF SYSTEMS: Full 14 system review of systems performed and notable only for those listed, all others are neg:  Constitutional:Fatigue  Cardiovascular: neg Ear/Nose/Throat: neg  Skin: neg Eyes:Blurred vision  Respiratory: neg Gastroitestinal:Constipation , urinary urgency  Hematology/Lymphatic: neg  Endocrine:Heat and cold intolerance  Musculoskeletal:neg Allergy/Immunology: neg Neurological:Memory loss, numbness in the left elbow and forearm  Psychiatric:Depression and anxiety  Sleep : neg   ALLERGIES: Allergies  Allergen Reactions  . Penicillins Hives, Other (See Comments) and Rash    Other Reaction: BRONCHOSPASM    HOME MEDICATIONS: Outpatient Prescriptions Prior to Visit  Medication Sig Dispense Refill  . calcium-vitamin D (OSCAL WITH D) 500-200 MG-UNIT per tablet Take 1 tablet by mouth daily.    . citalopram (CELEXA) 20 MG tablet Take 1 tablet (20 mg total) by mouth daily. 30 tablet 11  . etodolac (LODINE) 400  MG tablet Take 1 tablet (400 mg total) by mouth 2 (two) times daily. With food as needed 28 tablet 0  . gabapentin (NEURONTIN) 400 MG capsule Take 1 capsule (400 mg total) by mouth 2 (two) times daily. 60 capsule 11  . hydrOXYzine  (ATARAX/VISTARIL) 25 MG tablet Take 1 tablet (25 mg total) by mouth every 4 (four) hours as needed. 30 tablet 1  . lidocaine (LIDODERM) 5 % Place 1 patch onto the skin daily. Remove & Discard patch within 12 hours or as directed by MD 30 patch 11  . naproxen sodium (ALEVE) 220 MG tablet Take 220 mg by mouth 2 (two) times daily with a meal. PAIN    . oxyCODONE-acetaminophen (PERCOCET/ROXICET) 5-325 MG per tablet Take 1 tablet by mouth every 4 (four) hours as needed. 60 tablet 0  . predniSONE (DELTASONE) 20 MG tablet 3qd for 3d then 2qd for 3d then 1qd for 3d 18 tablet 0  . PRESCRIPTION MEDICATION PT GETS A COMPOUND PRESCRIPTION AT CUSTOM CARE. KETAMINE 10% LIDOCAINE 5% AMITRIPALINE 2%    . Vitamin D, Ergocalciferol, (DRISDOL) 50000 UNITS CAPS Take 50,000 Units by mouth every 7 (seven) days.     No facility-administered medications prior to visit.    PAST MEDICAL HISTORY: Past Medical History  Diagnosis Date  . Herniated cervical disc   . Memory loss   . Brachial neuritis or radiculitis NOS   . 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 Depression     PAST SURGICAL HISTORY: Past Surgical History  Procedure Laterality Date  . Arm surgery    . Left elbow    . Carpal tunnel release      FAMILY HISTORY: Family History  Problem Relation Age of Onset  . Stroke    . Heart disease    . High blood pressure      SOCIAL HISTORY: Social History   Social History  . Marital Status: Single    Spouse Name: N/A  . Number of Children: 2  . Years of Education: HS   Occupational History  .      disabled   Social History Main Topics  . Smoking status: Current Every Day Smoker -- 0.50 packs/day    Types: Cigarettes  . Smokeless tobacco: Never Used  . Alcohol Use: No  . Drug Use: No  . Sexual Activity: Not on file   Other Topics Concern  . Not on file   Social History Narrative   Patient is disabled. And she is divorced.    Caffeine one pot of coffee daily.   Left  handed.     PHYSICAL EXAM  Filed Vitals:   02/13/15 0924  BP: 113/78  Pulse: 77  Height: '4\' 11"'  (1.499 m)  Weight: 98 lb 12.8 oz (44.815 kg)   Body mass index is 19.94 kg/(m^2). Generalized: In no acute distress Neck: Supple, no carotid bruits  Cardiac: Regular rate rhythm Musculoskeletal: No deformity  Neurological examination Mentation: Alert oriented to time, place, history taking, and causual conversation Cranial nerve II-XII: Pupils were equal round reactive to light extraocular movements were full, visual field were full on confrontational test. facial sensation and strength were normal. hearing was intact to finger rubbing bilaterally. Uvula tongue midline. head turning and shoulder shrug and were normal and symmetric.Tongue protrusion into cheek strength was normal. Motor: much less spontaneous movement of the left arm, there was a well-healed left elbow scar, no significant weakness, she has skin hypersensitivity at left elbow, left  median forearm, tenderness of left levator scapular Sensory: Intact to pinprick, preserved vibratory sensation,in upper extremities Coordination: Normal finger to nose, heel-to-shin bilaterally there was no truncal ataxia Gait: Rising up from seated position without assistance, normal stance, without trunk ataxia, moderate stride, good arm swing, smooth turning, able to perform tiptoe, and heel walking without difficulty.  Deep tendon reflexes: Brachioradialis 2/2, biceps 2/2, triceps 2/2, patellar 2/2, Achilles 2/2, plantar responses were flexor bilaterally.   DIAGNOSTIC DATA (LABS, IMAGING, TESTING) - ASSESSMENT AND PLAN  59 y.o. 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  Continue Celexa at current dose will refill Continue gabapentin at current dose will refill Follow-up yearly  and when necessary  Dennie Bible, [facility], [facility], APRN  [facility] [address], [place], [place] [address] [phone]  continue alcohol

## 2016-02-13 ENCOUNTER — Ambulatory Visit (INDEPENDENT_AMBULATORY_CARE_PROVIDER_SITE_OTHER): Payer: Medicare Other | Admitting: Nurse Practitioner

## 2016-02-13 ENCOUNTER — Encounter: Payer: Self-pay | Admitting: Nurse Practitioner

## 2016-02-13 VITALS — BP 116/89 | HR 101 | Ht 59.0 in | Wt 105.8 lb

## 2016-02-13 DIAGNOSIS — M79602 Pain in left arm: Secondary | ICD-10-CM

## 2016-02-13 MED ORDER — CITALOPRAM HYDROBROMIDE 20 MG PO TABS: 20.0000 mg | ORAL_TABLET | Freq: Every day | ORAL | 11 refills | Status: DC

## 2016-02-13 MED ORDER — GABAPENTIN 400 MG PO CAPS: 400.0000 mg | ORAL_CAPSULE | Freq: Two times a day (BID) | ORAL | 11 refills | Status: DC

## 2016-02-13 NOTE — Progress Notes (Signed)
I have reviewed and agreed above plan. 

## 2016-02-13 NOTE — Progress Notes (Signed)
[facility]  PATIENT: Jean Perez DOB: 04-01-1956   REASON FOR VISIT: Follow-up for left elbow pain from previous injury and surgery  HISTORY FROM:Patient    HISTORY OF PRESENT ILLNESS: Jean Perez, 60 year old female returns for yearly follow-up. She has a history of left elbow injury and surgery.She is  on disability due to her left elbow injury, she denies gait difficulty, continuing to complains of left elbow discomfort, paresthesia, also frequent left shoulder blade area deep achy pain, her pain for the most part is controlled with gabapentin.  She also occasionally wears an elbow brace. At one time she was getting epidural injections for her neck pain with relief however for whatever reason that was stopped. Her last MRI cervical was in 2011. She has seen neurosurgery in the past. She was encouraged to follow up with them. She returns for reevaluation    HISTORY: Jean Perez is a 60 years old left-handed Caucasian female, patient of Dr. Erling Cruz, last clinical visit was in December 2013, she is here to followup of her left arm complex regional pain syndrome  She suffered a fall, and injury to her left elbow in March 28 2007, had a surgery by Dr. Burney Gauze in March 30 864, complicated by pain in her left arm , she had 14 Stellate ganglion block for complex regional syndrome, also had left carpal tunnel release surgery in 2009, she subsequently had surgery at [facility] in August 04 2008 with a left lateral condyle removal of the hardware, debridement of non-union, as well as the lateral collateral ligament reconstruction with allograft, and the left ulnar nerve decompression, despite all the effort, she continued to have left arm pain, from left elbow down, numbness tingling burning throbbing sensation, constant She complains altered sensation in the left thumb index third and half of the fourth finger, cannot feel objects such as clothing from the dryer to determine if this is  dry or wet MRI study of the brain February 2011 showed mild atrophy Laboratory evaluation showed normal B12, TSH, RPR, ESR  She had neuropsychiatric study in June 27 2009, August 24 2009, there was no cognitive deficit, it was felt that her anxiety, pain, and side effect of the pain medication causing her memory trouble She is independent of daily living, driving a car, EMG nerve conduction study in Sep 19 2009 showed changes of denervation, and reinnervation at left C7 MRI of the cervical in June 2011 showed mild left foraminal stenosis due to a small left posterior lateral disc protrusion at C6-7, and epidural steroid injection which increases stiffness in her neck, She is now using compounding cream to her left arm, lidocaine patches, she also suffered depression and anxiety, is taking gabapentin 400 mg twice a day, citalopram 20 mg once a day,   REVIEW OF SYSTEMS: Full 14 system review of systems performed and notable only for those listed, all others are neg:  Constitutional:Fatigue  Cardiovascular: neg Ear/Nose/Throat: neg  Skin: neg Eyes:Blurred vision  Respiratory: neg Gastroitestinal:Constipation , urinary urgency  Hematology/Lymphatic: neg  Endocrine:Heat and cold intolerance  Musculoskeletal: Joint pain neck pain stiffness Allergy/Immunology: neg Neurological:Memory loss, numbness in the left elbow and forearm  Psychiatric:Depression and anxiety  Sleep : neg   ALLERGIES: Allergies  Allergen Reactions  . Penicillins Hives, Other (See Comments) and Rash    Other Reaction: BRONCHOSPASM    HOME MEDICATIONS: Outpatient Medications Prior to Visit  Medication Sig Dispense Refill  . calcium-vitamin D (OSCAL WITH D) 500-200 MG-UNIT per tablet Take  1 tablet by mouth daily.    . citalopram (CELEXA) 20 MG tablet Take 1 tablet (20 mg total) by mouth daily. 30 tablet 11  . etodolac (LODINE) 400 MG tablet Take 1 tablet (400 mg total) by mouth 2 (two) times daily. With food as  needed 28 tablet 0  . gabapentin (NEURONTIN) 400 MG capsule Take 1 capsule (400 mg total) by mouth 2 (two) times daily. 60 capsule 11  . hydrOXYzine (ATARAX/VISTARIL) 25 MG tablet Take 1 tablet (25 mg total) by mouth every 4 (four) hours as needed. 30 tablet 1  . lidocaine (LIDODERM) 5 % Place 1 patch onto the skin daily. Remove & Discard patch within 12 hours or as directed by MD 30 patch 11  . naproxen sodium (ALEVE) 220 MG tablet Take 220 mg by mouth 2 (two) times daily with a meal. PAIN    . oxyCODONE-acetaminophen (PERCOCET/ROXICET) 5-325 MG per tablet Take 1 tablet by mouth every 4 (four) hours as needed. 60 tablet 0  . PRESCRIPTION MEDICATION PT GETS A COMPOUND PRESCRIPTION AT CUSTOM CARE. KETAMINE 10% LIDOCAINE 5% AMITRIPALINE 2%    . Vitamin D, Ergocalciferol, (DRISDOL) 50000 UNITS CAPS Take 50,000 Units by mouth every 7 (seven) days.     No facility-administered medications prior to visit.     PAST MEDICAL HISTORY: Past Medical History:  Diagnosis Date  . Brachial neuritis or radiculitis NOS   . Depression   . Herniated cervical disc   . Memory loss   . 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 arm surgery    . CARPAL TUNNEL RELEASE    . left elbow      FAMILY HISTORY: Family History  Problem Relation Age of Onset  . Stroke    . Heart disease    . High blood pressure      SOCIAL HISTORY: Social History   Social History  . Marital status: Single    Spouse name: N/A  . Number of children: 2  . Years of education: HS   Occupational History  .  Unemployed    disabled   Social History Main Topics  . Smoking status: Current Every Day Smoker    Packs/day: 0.50    Types: Cigarettes  . Smokeless tobacco: Never Used  . Alcohol use No  . Drug use: No  . Sexual activity: Not on file   Other Topics Concern  . Not on file   Social History Narrative   Patient is disabled.  And she is divorced.    Caffeine one pot of coffee daily.   Left handed.     PHYSICAL EXAM  Vitals:   02/13/16 0917  BP: 116/89  Pulse: (!) 101  Weight: 105 lb 12.8 oz (48 kg)  Height: '4\' 11"'  (1.499 m)   Body mass index is 21.37 kg/m. Generalized: In no acute distress Neck: Supple, no carotid bruits  Cardiac: Regular rate rhythm Musculoskeletal: No deformity  Neurological examination Mentation: Alert oriented to time, place, history taking, and causual conversation Cranial nerve II-XII: Pupils were equal round reactive to light extraocular movements were full, visual field were full on confrontational test. facial sensation and strength were normal. hearing was intact to finger rubbing bilaterally. Uvula tongue midline. head turning and shoulder shrug and were normal and symmetric.Tongue protrusion into cheek strength was normal. Motor: much less spontaneous movement of the left arm, there was a well-healed left  elbow scar, no significant weakness, she has skin hypersensitivity at left elbow, left median forearm, tenderness of left levator scapular Sensory: Intact to pinprick, preserved vibratory sensation,in upper extremities Coordination: Normal finger to nose, heel-to-shin bilaterally there was no truncal ataxia Gait: Rising up from seated position without assistance, normal stance, without trunk ataxia, moderate stride, good arm swing, smooth turning, able to perform tiptoe, and heel walking without difficulty.  Deep tendon reflexes: Symmetric upper and lower, plantar responses were flexor bilaterally.   DIAGNOSTIC DATA (LABS, IMAGING, TESTING) - ASSESSMENT AND PLAN  60 y.o. 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  Continue Celexa at current dose will refill Continue gabapentin at current dose will  refill Follow-up yearly and when necessary  Dennie Bible, [facility], [facility], APRN  [facility] [address] [place], [place] [address] [phone]  continue alcohol

## 2016-02-13 NOTE — Patient Instructions (Signed)
Continue Celexa at current dose will refill Continue gabapentin at current dose will refill Follow-up yearly and when necessary

## 2016-02-15 ENCOUNTER — Emergency Department (HOSPITAL_COMMUNITY): Payer: Medicare Other

## 2016-02-15 ENCOUNTER — Emergency Department (HOSPITAL_COMMUNITY)
Admission: EM | Admit: 2016-02-15 | Discharge: 2016-02-15 | Disposition: A | Discharge status: Home / Self Care | Payer: Medicare Other | Attending: Emergency Medicine | Admitting: Emergency Medicine

## 2016-02-15 ENCOUNTER — Other Ambulatory Visit (HOSPITAL_COMMUNITY)
Admission: RE | Admit: 2016-02-15 | Discharge: 2016-02-15 | Disposition: A | Discharge status: Home / Self Care | Payer: Self-pay | Source: Ambulatory Visit | Attending: Family Medicine | Admitting: Family Medicine

## 2016-02-15 ENCOUNTER — Ambulatory Visit (INDEPENDENT_AMBULATORY_CARE_PROVIDER_SITE_OTHER): Payer: Medicare Other | Admitting: Family Medicine

## 2016-02-15 ENCOUNTER — Telehealth: Payer: Self-pay | Admitting: Family Medicine

## 2016-02-15 ENCOUNTER — Encounter (HOSPITAL_COMMUNITY): Payer: Self-pay | Admitting: Emergency Medicine

## 2016-02-15 ENCOUNTER — Encounter: Payer: Self-pay | Admitting: Family Medicine

## 2016-02-15 VITALS — Temp 98.3°F | Ht 59.0 in | Wt 102.6 lb

## 2016-02-15 DIAGNOSIS — F17210 Nicotine dependence, cigarettes, uncomplicated: Secondary | ICD-10-CM | POA: Diagnosis not present

## 2016-02-15 DIAGNOSIS — Z79899 Other long term (current) drug therapy: Secondary | ICD-10-CM | POA: Insufficient documentation

## 2016-02-15 DIAGNOSIS — J029 Acute pharyngitis, unspecified: Secondary | ICD-10-CM | POA: Diagnosis not present

## 2016-02-15 DIAGNOSIS — D72829 Elevated white blood cell count, unspecified: Secondary | ICD-10-CM

## 2016-02-15 DIAGNOSIS — R591 Generalized enlarged lymph nodes: Secondary | ICD-10-CM | POA: Diagnosis present

## 2016-02-15 DIAGNOSIS — R509 Fever, unspecified: Secondary | ICD-10-CM | POA: Insufficient documentation

## 2016-02-15 DIAGNOSIS — R05 Cough: Secondary | ICD-10-CM | POA: Diagnosis not present

## 2016-02-15 LAB — CBC WITH DIFFERENTIAL/PLATELET
BASOS ABS: 0 10*3/uL (ref 0.0–0.1)
Basophils Absolute: 0 10*3/uL (ref 0.0–0.1)
Basophils Relative: 0 %
Basophils Relative: 0 %
EOS ABS: 0 10*3/uL (ref 0.0–0.7)
EOS ABS: 0.1 10*3/uL (ref 0.0–0.7)
EOS PCT: 0 %
EOS PCT: 0 %
HCT: 42.9 % (ref 36.0–46.0)
HCT: 39.5 % (ref 36.0–46.0)
Hemoglobin: 14.8 g/dL (ref 12.0–15.0)
Hemoglobin: 13.9 g/dL (ref 12.0–15.0)
LYMPHS ABS: 2.1 10*3/uL (ref 0.7–4.0)
LYMPHS ABS: 3.1 10*3/uL (ref 0.7–4.0)
LYMPHS PCT: 18 %
Lymphocytes Relative: 11 %
MCH: 32.3 pg (ref 26.0–34.0)
MCH: 32.4 pg (ref 26.0–34.0)
MCHC: 34.5 g/dL (ref 30.0–36.0)
MCHC: 35.2 g/dL (ref 30.0–36.0)
MCV: 93.7 fL (ref 78.0–100.0)
MCV: 92.1 fL (ref 78.0–100.0)
MONO ABS: 1.7 10*3/uL — AB (ref 0.1–1.0)
MONO ABS: 1.8 10*3/uL — AB (ref 0.1–1.0)
Monocytes Relative: 9 %
Monocytes Relative: 10 %
Neutro Abs: 16.1 10*3/uL — ABNORMAL HIGH (ref 1.7–7.7)
Neutro Abs: 12.4 10*3/uL — ABNORMAL HIGH (ref 1.7–7.7)
Neutrophils Relative %: 80 %
Neutrophils Relative %: 72 %
PLATELETS: 300 10*3/uL (ref 150–400)
PLATELETS: 296 10*3/uL (ref 150–400)
RBC: 4.58 MIL/uL (ref 3.87–5.11)
RBC: 4.29 MIL/uL (ref 3.87–5.11)
RDW: 13.5 % (ref 11.5–15.5)
RDW: 13.3 % (ref 11.5–15.5)
WBC: 20 10*3/uL — AB (ref 4.0–10.5)
WBC: 17.5 10*3/uL — AB (ref 4.0–10.5)

## 2016-02-15 LAB — INFLUENZA PANEL BY PCR (TYPE A & B)
H1N1FLUPCR: NOT DETECTED
INFLAPCR: NEGATIVE
INFLBPCR: NEGATIVE

## 2016-02-15 LAB — COMPREHENSIVE METABOLIC PANEL
ALT: 21 U/L (ref 14–54)
AST: 17 U/L (ref 15–41)
Albumin: 4.2 g/dL (ref 3.5–5.0)
Alkaline Phosphatase: 67 U/L (ref 38–126)
Anion gap: 7 (ref 5–15)
BUN: 13 mg/dL (ref 6–20)
CHLORIDE: 101 mmol/L (ref 101–111)
CO2: 26 mmol/L (ref 22–32)
Calcium: 9.5 mg/dL (ref 8.9–10.3)
Creatinine, Ser: 0.74 mg/dL (ref 0.44–1.00)
Glucose, Bld: 107 mg/dL — ABNORMAL HIGH (ref 65–99)
POTASSIUM: 3.7 mmol/L (ref 3.5–5.1)
SODIUM: 134 mmol/L — AB (ref 135–145)
Total Bilirubin: 0.3 mg/dL (ref 0.3–1.2)
Total Protein: 8 g/dL (ref 6.5–8.1)

## 2016-02-15 LAB — POCT RAPID STREP A (OFFICE): Rapid Strep A Screen: NEGATIVE

## 2016-02-15 LAB — I-STAT CG4 LACTIC ACID, ED: Lactic Acid, Venous: 1.24 mmol/L (ref 0.5–1.9)

## 2016-02-15 LAB — MONONUCLEOSIS SCREEN: MONO SCREEN: NEGATIVE

## 2016-02-15 LAB — RAPID STREP SCREEN (MED CTR MEBANE ONLY): STREPTOCOCCUS, GROUP A SCREEN (DIRECT): NEGATIVE

## 2016-02-15 MED ORDER — CLINDAMYCIN HCL 300 MG PO CAPS: 300.0000 mg | ORAL_CAPSULE | Freq: Three times a day (TID) | ORAL | 0 refills | Status: DC

## 2016-02-15 MED ORDER — CLINDAMYCIN HCL 150 MG PO CAPS
300.0000 mg | ORAL_CAPSULE | Freq: Once | ORAL | Status: AC
  Administered 2016-02-15: 300 mg via ORAL
  Filled 2016-02-15: qty 2

## 2016-02-15 MED ORDER — SODIUM CHLORIDE 0.9 % IV BOLUS (SEPSIS)
30.0000 mL/kg | Freq: Once | INTRAVENOUS | Status: DC
Start: 2016-02-15 — End: 2016-02-15

## 2016-02-15 MED ORDER — KETOROLAC TROMETHAMINE 30 MG/ML IJ SOLN
15.0000 mg | Freq: Once | INTRAMUSCULAR | Status: AC
  Administered 2016-02-15: 15 mg via INTRAVENOUS
  Filled 2016-02-15: qty 1

## 2016-02-15 MED ORDER — SODIUM CHLORIDE 0.9 % IV BOLUS (SEPSIS)
1000.0000 mL | Freq: Once | INTRAVENOUS | Status: AC
  Administered 2016-02-15: 1000 mL via INTRAVENOUS

## 2016-02-15 NOTE — ED Notes (Addendum)
Pt ambulating in room and hallway without problem.

## 2016-02-15 NOTE — Telephone Encounter (Signed)
Calling to check on blood work from today.

## 2016-02-15 NOTE — Progress Notes (Signed)
   Subjective:    Patient ID: Jean Perez, female    DOB: 04/10/1956, 60 y.o.   MRN: [id number]  Sore Throat   This is a new problem. The current episode started in the past 7 days. Associated symptoms include abdominal pain and headaches. Associated symptoms comments: fever.   Patient relates severe sore throat enlarged lymph nodes fever chills not feeling good some abdominal pain some headaches feels very rundown. Denies nausea vomiting diarrhea bloody stools. Eating some.   Review of Systems  Gastrointestinal: Positive for abdominal pain.  Neurological: Positive for headaches.  Severe sore throat fever chills headaches. Scalp tender to the touch. Denies any skin infection.     Objective:   Physical Exam Patient looks to feel ill. Does not appear toxic. Able to communicate smile appropriately. Neck large amount of anterior lymphadenopathy. Lungs are clear hearts regular throat minimal erythema eardrums normal       Assessment & Plan:  Pharyngitis with lymphadenopathy stat CBC ordered await the results. In addition this clindamycin 3 times a day for the next 7-10 days await DNA strep test. Follow-up if progressive troubles or worse. We will discuss later today with the results with the patient. If progressive symptoms are getting worse go to ER immediately  CBC returned showing 20,000 white count with significant bands and left shift. I spoke with the patient via phone she was still having fever chills sweats headache severe sore throat. I recommended that she be seen in ER for further evaluation. Evaluation for the possibility of bacteremia. I doubt meningitis. Sepsis testing can also be done. I told the patient is possible that they may run additional tests give her IV dose of antibiotics and allow her to go home or they may end up having to keep her in the hospital overnight. We certainly can follow her up on Monday in the office. ER was spoken with. I spoke with the triage nurse.

## 2016-02-15 NOTE — Discharge Instructions (Signed)
Take antibiotics as prescribed by Dr. Gerda DissLuking. keep herself hydrated. Use Tylenol or ibuprofen as needed for fever. Return to the ED with difficulty breathing, difficulty swallowing, confusion, any other concerns

## 2016-02-15 NOTE — ED Triage Notes (Signed)
PT states 2 days of headache, cold chills, body aches, sore throat, swelling to lymphnodes in neck. PT states she went to Dr. Gerda DissLuking this am and had a negative strep screen and blood work and he called her this evening and told her to come to ED d/t WBC elevated.

## 2016-02-15 NOTE — ED Notes (Signed)
Pt gone to xray

## 2016-02-15 NOTE — ED Provider Notes (Signed)
[facility]Y DEPT Provider Note   CSN: [id number] Arrival date & time: 02/15/16  4098     History   Chief Complaint Chief Complaint  Patient presents with  . Lymphadenopathy    HPI Jean Perez is a 60 y.o. female.  Patient sent by PCP with a 2 day history of generalized weakness, chills, headache, sore throat and body aches. Seen in the office today had a negative strep test and was started on clindamycin anyway. Called tonight and told to come to the hospital because her white blood cell count was 20. Patient states she has not checked her temperature at home but has had chills. Symptoms started as headache and weakness and nausea 2 nights ago. Headache was worse the next day with nausea but no vomiting. Today headache has improved but sore throat is her main complaint now. Did not receive a flu shot. Denies sick contacts or recent travel. Denies chest pain, cough, shortness of breath, abdominal pain, vomiting or diarrhea.   The history is provided by the patient.    Past Medical History:  Diagnosis Date  . Brachial neuritis or radiculitis NOS   . Depression   . Herniated cervical disc   . Memory loss   . 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 Left arm pain 02/10/2013    Past Surgical History:  Procedure Laterality Date  . arm surgery    . CARPAL TUNNEL RELEASE    . left elbow      OB History    No data available       Home Medications    Prior to Admission medications   Medication Sig Start Date End Date Taking? Authorizing Provider  calcium-vitamin D (OSCAL WITH D) 500-200 MG-UNIT per tablet Take 1 tablet by mouth daily.    Historical Provider, MD  citalopram (CELEXA) 20 MG tablet Take 1 tablet (20 mg total) by mouth daily. 02/13/16   Nilda Riggs, NP  clindamycin (CLEOCIN) 300 MG capsule Take 1 capsule (300 mg total) by mouth 3 (three) times daily. 02/15/16   Babs Sciara, MD    etodolac (LODINE) 400 MG tablet Take 1 tablet (400 mg total) by mouth 2 (two) times daily. With food as needed 05/18/14   Merlyn Albert, MD  gabapentin (NEURONTIN) 400 MG capsule Take 1 capsule (400 mg total) by mouth 2 (two) times daily. 02/13/16   Nilda Riggs, NP  hydrOXYzine (ATARAX/VISTARIL) 25 MG tablet Take 1 tablet (25 mg total) by mouth every 4 (four) hours as needed. 11/30/14   Babs Sciara, MD  lidocaine (LIDODERM) 5 % Place 1 patch onto the skin daily. Remove & Discard patch within 12 hours or as directed by MD 02/10/14   Levert Feinstein, MD  naproxen sodium (ALEVE) 220 MG tablet Take 220 mg by mouth 2 (two) times daily with a meal. PAIN    Historical Provider, MD  oxyCODONE-acetaminophen (PERCOCET/ROXICET) 5-325 MG per tablet Take 1 tablet by mouth every 4 (four) hours as needed. 02/10/14   Levert Feinstein, MD  PRESCRIPTION MEDICATION PT GETS A COMPOUND PRESCRIPTION AT [facility]USTOM CARE. KETAMINE 10% LIDOCAINE 5% AMITRIPALINE 2%    Historical Provider, MD  UNABLE TO FIND TENS UNIT    Historical Provider, MD  Vitamin D, Ergocalciferol, (DRISDOL) 50000 UNITS CAPS Take 50,000 Units by mouth every 7 (seven) days.    Historical Provider, MD    Family History  Family History  Problem Relation Age of Onset  . Stroke    . Heart disease    . High blood pressure      Social History Social History  Substance Use Topics  . Smoking status: Current Every Day Smoker    Packs/day: 0.50    Types: Cigarettes  . Smokeless tobacco: Never Used  . Alcohol use No     Allergies   Penicillins   Review of Systems Review of Systems  Constitutional: Positive for activity change and appetite change. Negative for diaphoresis and fatigue.  HENT: Positive for rhinorrhea and sore throat. Negative for congestion.   Eyes: Negative for photophobia and visual disturbance.  Respiratory: Negative for cough, chest tightness, shortness of breath and wheezing.   Cardiovascular: Negative for chest pain and  palpitations.  Gastrointestinal: Negative for abdominal pain, nausea and vomiting.  Genitourinary: Negative for dysuria and hematuria.  Musculoskeletal: Positive for arthralgias and myalgias.  Skin: Negative for wound.  Neurological: Positive for headaches. Negative for dizziness, weakness and light-headedness.  A complete 10 system review of systems was obtained and all systems are negative except as noted in the HPI and PMH.     Physical Exam Updated Vital Signs BP 107/89 (BP Location: Right Arm)   Pulse 105   Temp 98.4 F (36.9 C) (Oral)   Resp 18   Ht 4\' 11"  (1.499 m)   Wt 102 lb (46.3 kg)   LMP  (LMP Unknown)   SpO2 100%   BMI 20.60 kg/m   Physical Exam  Constitutional: She is oriented to person, place, and time. She appears well-developed and well-nourished. No distress.  Smiling and nontoxic appearing  HENT:  Head: Normocephalic and atraumatic.  Right Ear: External ear normal.  Left Ear: External ear normal.  Mouth/Throat: Oropharyngeal exudate present.  Tympanic membranes are normal. Oropharynx appears normal with minimal erythema and scant exudates. No asymmetry  Eyes: Conjunctivae and EOM are normal. Pupils are equal, round, and reactive to light.  Neck: Normal range of motion. Neck supple.  No meningismus. FROM without pain Tender cervical lymphadenopathy  Cardiovascular: Normal rate, regular rhythm, normal heart sounds and intact distal pulses.  Exam reveals no gallop.   No murmur heard. Pulmonary/Chest: Effort normal and breath sounds normal. No respiratory distress. She exhibits no tenderness.  Abdominal: Soft. There is no tenderness. There is no rebound and no guarding.  Musculoskeletal: Normal range of motion. She exhibits no edema or tenderness.  Lymphadenopathy:    She has cervical adenopathy.  Neurological: She is alert and oriented to person, place, and time. No cranial nerve deficit. She exhibits normal muscle tone. Coordination normal.   5/5  strength throughout. CN 2-12 intact.Equal grip strength.   Skin: Skin is warm.  Psychiatric: She has a normal mood and affect. Her behavior is normal.  Nursing note and vitals reviewed.    ED Treatments / Results  Labs (all labs ordered are listed, but only abnormal results are displayed) Labs Reviewed  CBC WITH DIFFERENTIAL/PLATELET - Abnormal; Notable for the following:       Result Value   WBC 17.5 (*)    Neutro Abs 12.4 (*)    Monocytes Absolute 1.8 (*)    All other components within normal limits  COMPREHENSIVE METABOLIC PANEL - Abnormal; Notable for the following:    Sodium 134 (*)    Glucose, Bld 107 (*)    All other components within normal limits  CULTURE, BLOOD (ROUTINE X 2)  CULTURE, BLOOD (ROUTINE X  2)  RAPID STREP SCREEN (NOT AT [facility])  CULTURE, GROUP A STREP [facility])  MONONUCLEOSIS SCREEN  INFLUENZA PANEL BY PCR (TYPE A & B, H1N1)  I-STAT CG4 LACTIC ACID, ED    EKG  EKG Interpretation None       Radiology Dg Chest 2 View  Result Date: 02/15/2016 CLINICAL DATA:  Cough, fever in 2 days of headache. EXAM: CHEST  2 VIEW COMPARISON:  04/16/2012 FINDINGS: The heart size and mediastinal contours are within normal limits. Both lungs are clear. Mild upper lobe emphysematous hyperinflation is again noted, left greater than right. The visualized skeletal structures are unremarkable. IMPRESSION: No active cardiopulmonary disease. Electronically Signed   By: Tollie Eth M.D.   On: 02/15/2016 20:03    Procedures Procedures (including critical care time)  Medications Ordered in ED Medications  sodium chloride 0.9 % bolus 1,000 mL (not administered)     Initial Impression / Assessment and Plan / ED Course  I have reviewed the triage vital signs and the nursing notes.  Pertinent labs & imaging results that were available during my care of the patient were reviewed by me and considered in my medical decision making (see chart for details).  Clinical Course    Patient with 2 days of body aches, chills, sore throat and headache. Sent by PCP with leukocytosis. She is afebrile. She has no meningismus. Her headache is minimal at this time.  Labs remarkable for leukocytosis. Lactate is normal. Patient is afebrile. Flu swab was negative. Monospot is negative. Chest x-rays negative.  Continue clindamycin as prescribed by PCP for for pharyngitis. Patient appears nontoxic. No meningismus. Denies headache. Exam not consistent with meningitis.  Discussed with patient who is tolerating by mouth and ambulatory. She is smiling and interactive with the family. Continue clindamycin follow-up with PCP. Return to the ED with new or worsening symptoms. Return precautions discussed.  Final Clinical Impressions(s) / ED Diagnoses   Final diagnoses:  Pharyngitis, unspecified etiology  Leukocytosis, unspecified type    New Prescriptions New Prescriptions   No medications on file     Glynn Octave, MD 02/16/16 [phone]

## 2016-02-16 LAB — STREP A DNA PROBE: Strep Gp A Direct, DNA Probe: NEGATIVE

## 2016-02-17 NOTE — Telephone Encounter (Signed)
I did discuss with the patient the results of her tests because of her symptomatology and elevated white count she was referred to the ER

## 2016-02-20 LAB — CULTURE, GROUP A STREP (THRC)

## 2016-02-20 LAB — CULTURE, BLOOD (ROUTINE X 2)
Culture: NO GROWTH
Culture: NO GROWTH

## 2016-02-21 ENCOUNTER — Telehealth (HOSPITAL_BASED_OUTPATIENT_CLINIC_OR_DEPARTMENT_OTHER): Payer: Self-pay | Admitting: Emergency Medicine

## 2016-02-21 NOTE — Telephone Encounter (Signed)
Post ED Visit - Positive Culture Follow-up  Culture report reviewed by antimicrobial stewardship pharmacist:  []  Jean Perez, Pharm.D. []  Jean Perez, [address] BCPS []  Garvin FilaMike Perez, Pharm.D. []  Georgina PillionElizabeth Perez, Pharm.D., BCPS []  [place] Perez, [address]Ph[address]D., BCPS, AAHIVP []  Estella HuskMichelle Perez, Pharm.D., BCPS, AAHIVP []  [name], Pharm.D. []  [name], [address] Jean Perez PharmD  Positive strep culture Treated with clindamycin, organism sensitive to the same and no further patient follow-up is required at this time.  Jean Perez, Jean Perez 02/21/2016, 9:50 AM

## 2016-06-30 DIAGNOSIS — S82435A Nondisplaced oblique fracture of shaft of left fibula, initial encounter for closed fracture: Secondary | ICD-10-CM | POA: Diagnosis not present

## 2016-06-30 DIAGNOSIS — S82832A Other fracture of upper and lower end of left fibula, initial encounter for closed fracture: Secondary | ICD-10-CM | POA: Diagnosis not present

## 2016-06-30 DIAGNOSIS — F17200 Nicotine dependence, unspecified, uncomplicated: Secondary | ICD-10-CM | POA: Diagnosis not present

## 2016-06-30 DIAGNOSIS — S82402A Unspecified fracture of shaft of left fibula, initial encounter for closed fracture: Secondary | ICD-10-CM | POA: Diagnosis not present

## 2016-07-01 DIAGNOSIS — S82852A Displaced trimalleolar fracture of left lower leg, initial encounter for closed fracture: Secondary | ICD-10-CM | POA: Diagnosis not present

## 2016-07-02 DIAGNOSIS — Z136 Encounter for screening for cardiovascular disorders: Secondary | ICD-10-CM | POA: Diagnosis not present

## 2016-07-04 DIAGNOSIS — G8918 Other acute postprocedural pain: Secondary | ICD-10-CM | POA: Diagnosis not present

## 2016-07-04 DIAGNOSIS — S82852A Displaced trimalleolar fracture of left lower leg, initial encounter for closed fracture: Secondary | ICD-10-CM | POA: Diagnosis not present

## 2016-07-04 DIAGNOSIS — F418 Other specified anxiety disorders: Secondary | ICD-10-CM | POA: Diagnosis not present

## 2016-07-10 DIAGNOSIS — S82852D Displaced trimalleolar fracture of left lower leg, subsequent encounter for closed fracture with routine healing: Secondary | ICD-10-CM | POA: Diagnosis not present

## 2016-07-24 DIAGNOSIS — S82852D Displaced trimalleolar fracture of left lower leg, subsequent encounter for closed fracture with routine healing: Secondary | ICD-10-CM | POA: Diagnosis not present

## 2016-07-24 DIAGNOSIS — I82812 Embolism and thrombosis of superficial veins of left lower extremities: Secondary | ICD-10-CM | POA: Diagnosis not present

## 2016-08-11 DIAGNOSIS — S82852D Displaced trimalleolar fracture of left lower leg, subsequent encounter for closed fracture with routine healing: Secondary | ICD-10-CM | POA: Diagnosis not present

## 2016-08-22 DIAGNOSIS — M25572 Pain in left ankle and joints of left foot: Secondary | ICD-10-CM | POA: Diagnosis not present

## 2016-08-22 DIAGNOSIS — S8262XA Displaced fracture of lateral malleolus of left fibula, initial encounter for closed fracture: Secondary | ICD-10-CM | POA: Diagnosis not present

## 2016-09-08 DIAGNOSIS — M25572 Pain in left ankle and joints of left foot: Secondary | ICD-10-CM | POA: Diagnosis not present

## 2016-09-08 DIAGNOSIS — S8262XD Displaced fracture of lateral malleolus of left fibula, subsequent encounter for closed fracture with routine healing: Secondary | ICD-10-CM | POA: Diagnosis not present

## 2016-09-09 DIAGNOSIS — S8262XD Displaced fracture of lateral malleolus of left fibula, subsequent encounter for closed fracture with routine healing: Secondary | ICD-10-CM | POA: Diagnosis not present

## 2016-09-11 DIAGNOSIS — S8262XD Displaced fracture of lateral malleolus of left fibula, subsequent encounter for closed fracture with routine healing: Secondary | ICD-10-CM | POA: Diagnosis not present

## 2016-09-16 DIAGNOSIS — S8262XD Displaced fracture of lateral malleolus of left fibula, subsequent encounter for closed fracture with routine healing: Secondary | ICD-10-CM | POA: Diagnosis not present

## 2016-09-18 DIAGNOSIS — S8262XD Displaced fracture of lateral malleolus of left fibula, subsequent encounter for closed fracture with routine healing: Secondary | ICD-10-CM | POA: Diagnosis not present

## 2016-09-23 DIAGNOSIS — S8262XD Displaced fracture of lateral malleolus of left fibula, subsequent encounter for closed fracture with routine healing: Secondary | ICD-10-CM | POA: Diagnosis not present

## 2016-09-25 DIAGNOSIS — S8262XD Displaced fracture of lateral malleolus of left fibula, subsequent encounter for closed fracture with routine healing: Secondary | ICD-10-CM | POA: Diagnosis not present

## 2016-09-30 DIAGNOSIS — S8262XD Displaced fracture of lateral malleolus of left fibula, subsequent encounter for closed fracture with routine healing: Secondary | ICD-10-CM | POA: Diagnosis not present

## 2016-10-02 DIAGNOSIS — S8262XD Displaced fracture of lateral malleolus of left fibula, subsequent encounter for closed fracture with routine healing: Secondary | ICD-10-CM | POA: Diagnosis not present

## 2016-10-07 DIAGNOSIS — S8262XD Displaced fracture of lateral malleolus of left fibula, subsequent encounter for closed fracture with routine healing: Secondary | ICD-10-CM | POA: Diagnosis not present

## 2016-10-08 DIAGNOSIS — S8262XD Displaced fracture of lateral malleolus of left fibula, subsequent encounter for closed fracture with routine healing: Secondary | ICD-10-CM | POA: Diagnosis not present

## 2016-10-29 ENCOUNTER — Ambulatory Visit (INDEPENDENT_AMBULATORY_CARE_PROVIDER_SITE_OTHER): Payer: Medicare Other | Admitting: Family Medicine

## 2016-10-29 ENCOUNTER — Encounter: Payer: Self-pay | Admitting: Family Medicine

## 2016-10-29 ENCOUNTER — Ambulatory Visit (HOSPITAL_COMMUNITY)
Admission: RE | Admit: 2016-10-29 | Discharge: 2016-10-29 | Disposition: A | Discharge status: Home / Self Care | Payer: Medicare Other | Source: Ambulatory Visit | Attending: Family Medicine | Admitting: Family Medicine

## 2016-10-29 VITALS — BP 120/84 | Temp 97.8°F | Ht 59.0 in | Wt 101.0 lb

## 2016-10-29 DIAGNOSIS — M79675 Pain in left toe(s): Secondary | ICD-10-CM

## 2016-10-29 MED ORDER — ETODOLAC 400 MG PO TABS: ORAL_TABLET | ORAL | 0 refills | Status: DC

## 2016-10-29 MED ORDER — DOXYCYCLINE HYCLATE 100 MG PO TABS: 100.0000 mg | ORAL_TABLET | Freq: Two times a day (BID) | ORAL | 0 refills | Status: AC

## 2016-10-29 NOTE — Progress Notes (Signed)
   Subjective:    Patient ID: Jean RaringSusan A Perez, female    DOB: 09/25/1955, 61 y.o.   MRN: [id number]  Toe Pain   The incident occurred more than 1 week ago. The pain is present in the left toes. She has tried ice (aleve, increased fluids) for the symptoms.   Patient states no other concerns this visit.   Had plae and screw   In left ankle after serious fx this spring  Banged great toe had very serious pain and swelling  Toe was so swollen  Could t put preddure on the foot and    Developed a superficial thrombo phlebitis, took aspirin fo three wks  Now seing dr Amanda Peagramig for follow up  vwey tender   Review of Systems No headache, no major weight loss or weight gain, no chest pain no back pain abdominal pain no change in bowel habits complete ROS otherwise negative     Objective:   Physical Exam Alert vitals stable, NAD. Blood pressure good on repeat. HEENT normal. Lungs clear. Heart regular rate and rhythm. Left great toe inflamed tender erythematous. Not primarily tender at the metacarpophalangeal joint but more distally       Assessment & Plan:  Impression foot injury with a chronic element since ankle fracture. Somewhat puzzling presentation. Patient worried about gout that the gout chance of gout very low. Distal toe inflamed almost element of cellulitis however more arthritis description. We will x-ray foot. Plus blood work. Cover with anti-inflammatory and antibiotic and encouragement back to orthopedist falling for foot

## 2016-10-30 LAB — CBC WITH DIFFERENTIAL/PLATELET
BASOS: 0 %
Basophils Absolute: 0.1 10*3/uL (ref 0.0–0.2)
EOS (ABSOLUTE): 0.1 10*3/uL (ref 0.0–0.4)
Eos: 1 %
Hematocrit: 41.6 % (ref 34.0–46.6)
Hemoglobin: 13.7 g/dL (ref 11.1–15.9)
Immature Grans (Abs): 0 10*3/uL (ref 0.0–0.1)
Immature Granulocytes: 0 %
Lymphocytes Absolute: 2.9 10*3/uL (ref 0.7–3.1)
Lymphs: 24 %
MCH: 31 pg (ref 26.6–33.0)
MCHC: 32.9 g/dL (ref 31.5–35.7)
MCV: 94 fL (ref 79–97)
MONOS ABS: 0.4 10*3/uL (ref 0.1–0.9)
Monocytes: 3 %
NEUTROS ABS: 8.8 10*3/uL — AB (ref 1.4–7.0)
Neutrophils: 72 %
PLATELETS: 366 10*3/uL (ref 150–379)
RBC: 4.42 x10E6/uL (ref 3.77–5.28)
RDW: 14 % (ref 12.3–15.4)
WBC: 12.2 10*3/uL — ABNORMAL HIGH (ref 3.4–10.8)

## 2016-10-30 LAB — URIC ACID: URIC ACID: 4.3 mg/dL (ref 2.5–7.1)

## 2016-11-02 ENCOUNTER — Encounter: Payer: Self-pay | Admitting: Family Medicine

## 2016-11-03 DIAGNOSIS — M19072 Primary osteoarthritis, left ankle and foot: Secondary | ICD-10-CM | POA: Diagnosis not present

## 2016-11-03 DIAGNOSIS — M25572 Pain in left ankle and joints of left foot: Secondary | ICD-10-CM | POA: Diagnosis not present

## 2016-11-03 DIAGNOSIS — S8262XD Displaced fracture of lateral malleolus of left fibula, subsequent encounter for closed fracture with routine healing: Secondary | ICD-10-CM | POA: Diagnosis not present

## 2016-11-03 DIAGNOSIS — M79675 Pain in left toe(s): Secondary | ICD-10-CM | POA: Diagnosis not present

## 2016-11-25 ENCOUNTER — Other Ambulatory Visit: Payer: Self-pay | Admitting: Family Medicine

## 2016-12-03 DIAGNOSIS — M2022 Hallux rigidus, left foot: Secondary | ICD-10-CM | POA: Diagnosis not present

## 2016-12-25 ENCOUNTER — Other Ambulatory Visit: Payer: Self-pay | Admitting: Family Medicine

## 2017-02-16 ENCOUNTER — Encounter: Payer: Self-pay | Admitting: Neurology

## 2017-02-16 ENCOUNTER — Ambulatory Visit (INDEPENDENT_AMBULATORY_CARE_PROVIDER_SITE_OTHER): Payer: Medicare Other | Admitting: Neurology

## 2017-02-16 ENCOUNTER — Encounter (INDEPENDENT_AMBULATORY_CARE_PROVIDER_SITE_OTHER): Payer: Self-pay

## 2017-02-16 VITALS — BP 117/84 | HR 85 | Ht 58.5 in | Wt 100.0 lb

## 2017-02-16 DIAGNOSIS — M79602 Pain in left arm: Secondary | ICD-10-CM | POA: Diagnosis not present

## 2017-02-16 DIAGNOSIS — F329 Major depressive disorder, single episode, unspecified: Secondary | ICD-10-CM | POA: Diagnosis not present

## 2017-02-16 DIAGNOSIS — I63112 Cerebral infarction due to embolism of left vertebral artery: Secondary | ICD-10-CM

## 2017-02-16 DIAGNOSIS — F32A Depression, unspecified: Secondary | ICD-10-CM

## 2017-02-16 MED ORDER — CITALOPRAM HYDROBROMIDE 20 MG PO TABS: 20.0000 mg | ORAL_TABLET | Freq: Every day | ORAL | 4 refills | Status: DC

## 2017-02-16 MED ORDER — GABAPENTIN 400 MG PO CAPS: 400.0000 mg | ORAL_CAPSULE | Freq: Two times a day (BID) | ORAL | 4 refills | Status: DC

## 2017-02-16 NOTE — Progress Notes (Signed)
[facility]  PATIENT: Jean Perez DOB: [birth year]   HISTORY: Jean Perez is a 61 years old left-handed Caucasian female, patient of Dr. Erling Cruz, last clinical visit was in December 2013, she is here to followup of her left arm complex regional pain syndrome  She suffered a fall, and injury to her left elbow in March 28 2007, had a surgery by Dr. Burney Gauze in March 30 2840, complicated by pain in her left arm , she had 14 Stellate ganglion block for complex regional syndrome, also had left carpal tunnel release surgery in 2009, she subsequently had surgery at [facility] in August 04 2008 with a left lateral condyle removal of the hardware, debridement of non-union, as well as the lateral collateral ligament reconstruction with allograft, and the left ulnar nerve decompression, despite all the effort, she continued to have left arm pain, from left elbow down, numbness tingling burning throbbing sensation, constant She complains altered sensation in the left thumb index third and half of the fourth finger, cannot feel objects such as clothing from the dryer to determine if this is dry or wet MRI study of the brain February 2011 showed mild atrophy Laboratory evaluation showed normal B12, TSH, RPR, ESR  She had neuropsychiatric study in June 27 2009, August 24 2009, there was no cognitive deficit, it was felt that her anxiety, pain, and side effect of the pain medication causing her memory trouble She is independent of daily living, driving a car, EMG nerve conduction study in Sep 19 2009 showed changes of denervation, and reinnervation at left C7 MRI of the cervical in June 2011 showed mild left foraminal stenosis due to a small left posterior lateral disc protrusion at C6-7, and epidural steroid injection which increases stiffness in her neck, She is now using compounding cream to her left arm, lidocaine patches, she also suffered depression and anxiety, is taking gabapentin 400 mg  twice a day, citalopram 20 mg once a day,  UPDATE Feb 16 2017: Today she came in with left elbow pad, continue complains of left arm hypersensitivity, also complains of hearing crying easily because both of her children moved away, she is taking gabapentin 400 twice a day, Celexa 20 mg daily, higher dose would cause slow reaction increased forgetfulness, she also noticed left palm hard callus at the fourth metacarpal supple, tightness of the left hand flexor tendons,  REVIEW OF SYSTEMS: Full 14 system review of systems performed and notable only for those listed, all others are neg:     ALLERGIES: Allergies  Allergen Reactions  . Penicillins Hives, Rash and Other (See Comments)    Has patient had a PCN reaction causing immediate rash, facial/tongue/throat swelling, SOB or lightheadedness with hypotension: Yes Has patient had a PCN reaction causing severe rash involving mucus membranes or skin necrosis: Yes Has patient had a PCN reaction that required hospitalization No Has patient had a PCN reaction occurring within the last 10 years: No If all of the above answers are "NO", then may proceed with Cephalosporin use.   Other Reaction: BRONCHOSPASM    HOME MEDICATIONS: Outpatient Medications Prior to Visit  Medication Sig Dispense Refill  . calcium-vitamin D (OSCAL WITH D) 500-200 MG-UNIT per tablet Take 1 tablet by mouth daily.    . citalopram (CELEXA) 20 MG tablet Take 1 tablet (20 mg total) by mouth daily. 30 tablet 11  . gabapentin (NEURONTIN) 400 MG capsule Take 1 capsule (400 mg total) by mouth 2 (two) times daily. 60 capsule 11  .  lidocaine (LIDODERM) 5 % Place 1 patch onto the skin daily. Remove & Discard patch within 12 hours or as directed by MD (Patient taking differently: Place 1 patch onto the skin daily as needed. Remove & Discard patch within 12 hours or as directed by MD) 30 patch 11  . PRESCRIPTION MEDICATION Apply 1 application topically daily as needed (for pain). PT GETS  A COMPOUND PRESCRIPTION AT CUSTOM CARE. KETAMINE 10% LIDOCAINE 5% AMITRIPALINE 2%     . UNABLE TO FIND TENS UNIT    . Vitamin D, Ergocalciferol, (DRISDOL) 50000 UNITS CAPS Take 50,000 Units by mouth every Friday.     . etodolac (LODINE) 400 MG tablet TAKE 1 TABLET BY MOUTH TWICE DAILY WITH FOOD 60 tablet 0  . naproxen sodium (ALEVE) 220 MG tablet Take 220-440 mg by mouth daily as needed (for pain).      No facility-administered medications prior to visit.     PAST MEDICAL HISTORY: Past Medical History:  Diagnosis Date  . Brachial neuritis or radiculitis NOS   . Depression   . Herniated cervical disc   . Memory loss   . 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 arm surgery    . CARPAL TUNNEL RELEASE    . left elbow      FAMILY HISTORY: Family History  Problem Relation Age of Onset  . Stroke Unknown   . Heart disease Unknown   . High blood pressure Unknown     SOCIAL HISTORY: Social History   Social History  . Marital status: Single    Spouse name: N/A  . Number of children: 2  . Years of education: HS   Occupational History  .  Unemployed    disabled   Social History Main Topics  . Smoking status: Current Every Day Smoker    Packs/day: 0.50    Types: Cigarettes  . Smokeless tobacco: Never Used  . Alcohol use No  . Drug use: No  . Sexual activity: Not on file   Other Topics Concern  . Not on file   Social History Narrative   Patient is disabled. And she is divorced.    Caffeine one pot of coffee daily.   Left handed.     PHYSICAL EXAM  Vitals:   02/16/17 0951  BP: 117/84  Pulse: 85  Weight: 100 lb (45.4 kg)  Height: 4' 10.5" (1.486 m)   Body mass index is 20.54 kg/m.  PHYSICAL EXAMNIATION:  Gen: NAD, conversant, well nourised, obese, well groomed                     Cardiovascular: Regular rate rhythm, no peripheral edema, warm, nontender. Eyes:  Conjunctivae clear without exudates or hemorrhage Neck: Supple, no carotid bruits. Pulmonary: Clear to auscultation bilaterally   NEUROLOGICAL EXAM:  MENTAL STATUS: Speech:    Speech is normal; fluent and spontaneous with normal comprehension.  Cognition:     Orientation to time, place and person     Normal recent and remote memory     Normal Attention span and concentration     Normal Language, naming, repeating,spontaneous speech     Fund of knowledge   CRANIAL NERVES: CN II: Visual fields are full to confrontation. Fundoscopic exam is normal with sharp discs and no vascular changes. Pupils are round equal and briskly reactive to light. CN III, IV, VI: extraocular movement are normal. No  ptosis. CN V: Facial sensation is intact to pinprick in all 3 divisions bilaterally. Corneal responses are intact.  CN VII: Face is symmetric with normal eye closure and smile. CN VIII: Hearing is normal to rubbing fingers CN IX, X: Palate elevates symmetrically. Phonation is normal. CN XI: Head turning and shoulder shrug are intact CN XII: Tongue is midline with normal movements and no atrophy.  MOTOR: There is no pronator drift of out-stretched arms. Muscle bulk and tone are normal. Muscle strength is normal. Left elbow in wrap  REFLEXES: Reflexes are 2+ and symmetric at the biceps, triceps, knees, and ankles. Plantar responses are flexor.  SENSORY: Intact to light touch, pinprick, positional and vibratory sensation are intact in fingers and toes.  COORDINATION: Rapid alternating movements and fine finger movements are intact. There is no dysmetria on finger-to-nose and heel-knee-shin.    GAIT/STANCE: Posture is normal. Gait is steady with normal steps, base, arm swing, and turning. Heel and toe walking are normal. Tandem gait is normal.  Romberg is absent.       DIAGNOSTIC DATA (LABS, IMAGING, TESTING) - ASSESSMENT AND PLAN  61 y.o. year old female    Left elbow pain  Complex  regional pain syndrome  Keep gabapentin 400 twice a day  Left hand flexor tendon tightness  Refer her hand surgeon  Depression anxiety  Celexa 20 mg daily     Marcial Pacas, M.D. Ph.D.  [facility] [place], [place] [address] Phone: [phone] Fax:      [phone]

## 2017-03-24 ENCOUNTER — Telehealth: Payer: Self-pay | Admitting: Neurology

## 2017-03-24 NOTE — Telephone Encounter (Signed)
[name] with [facility] is calling regarding referral for patient to see Dr. Merlyn LotKuzma or Dr. Earl GalaWinegold. Both doctors have declined to see the patient.

## 2017-04-01 NOTE — Telephone Encounter (Signed)
[facility] to [facility] to see if they can help patient . Telephone [phone] -fax [phone] .

## 2017-10-09 IMAGING — DX DG FOOT COMPLETE 3+V*L*
3 series · 3 of 3 positions shown · non-contrast
Comparison: No recent prior .

CLINICAL DATA: Left great toe pain.  Injury.

EXAM:
LEFT FOOT - COMPLETE 3+ VIEW

[foot ap]
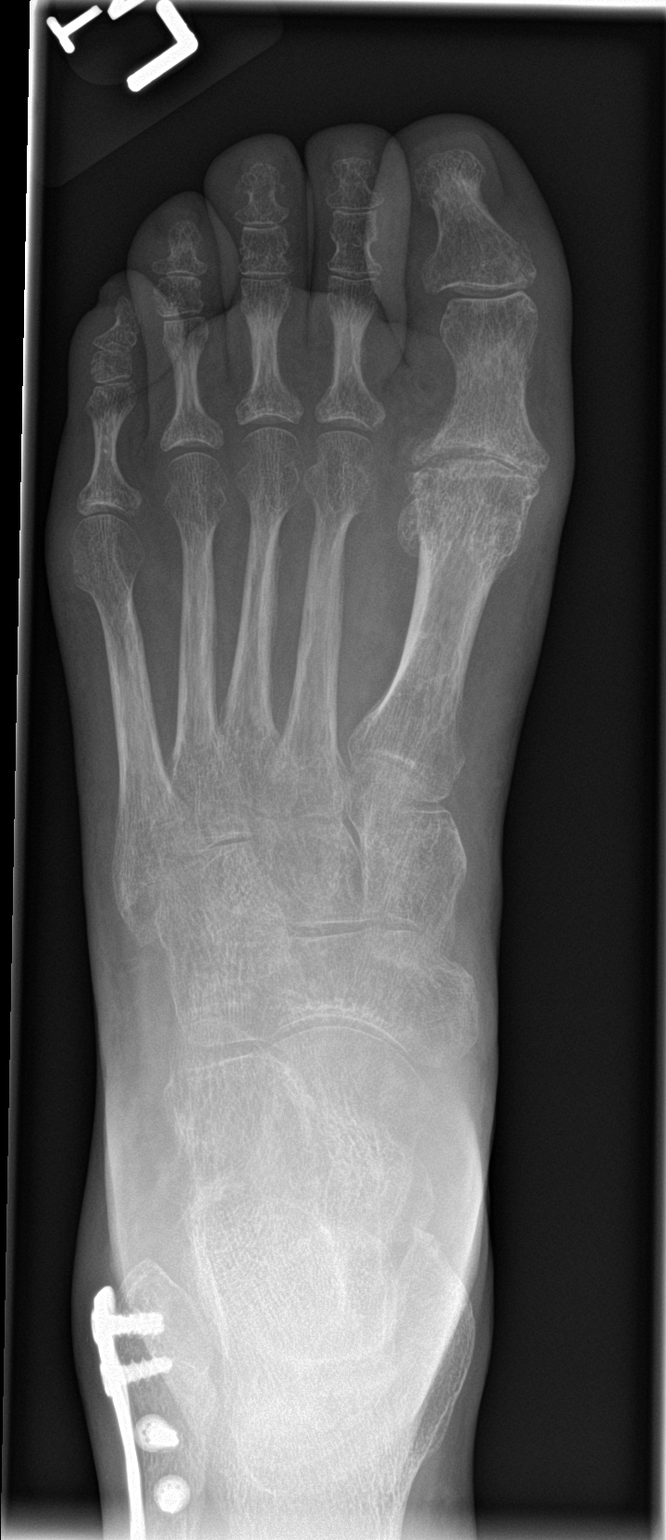

[foot obl]
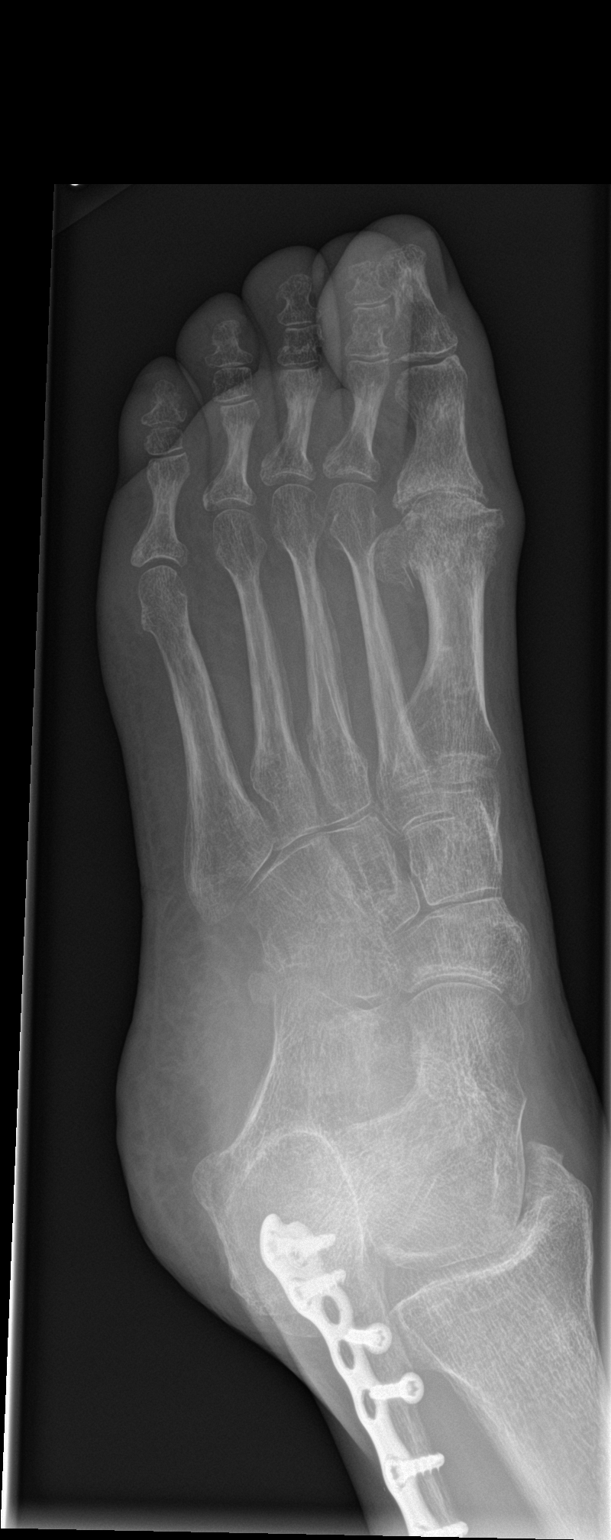

[foot lat]
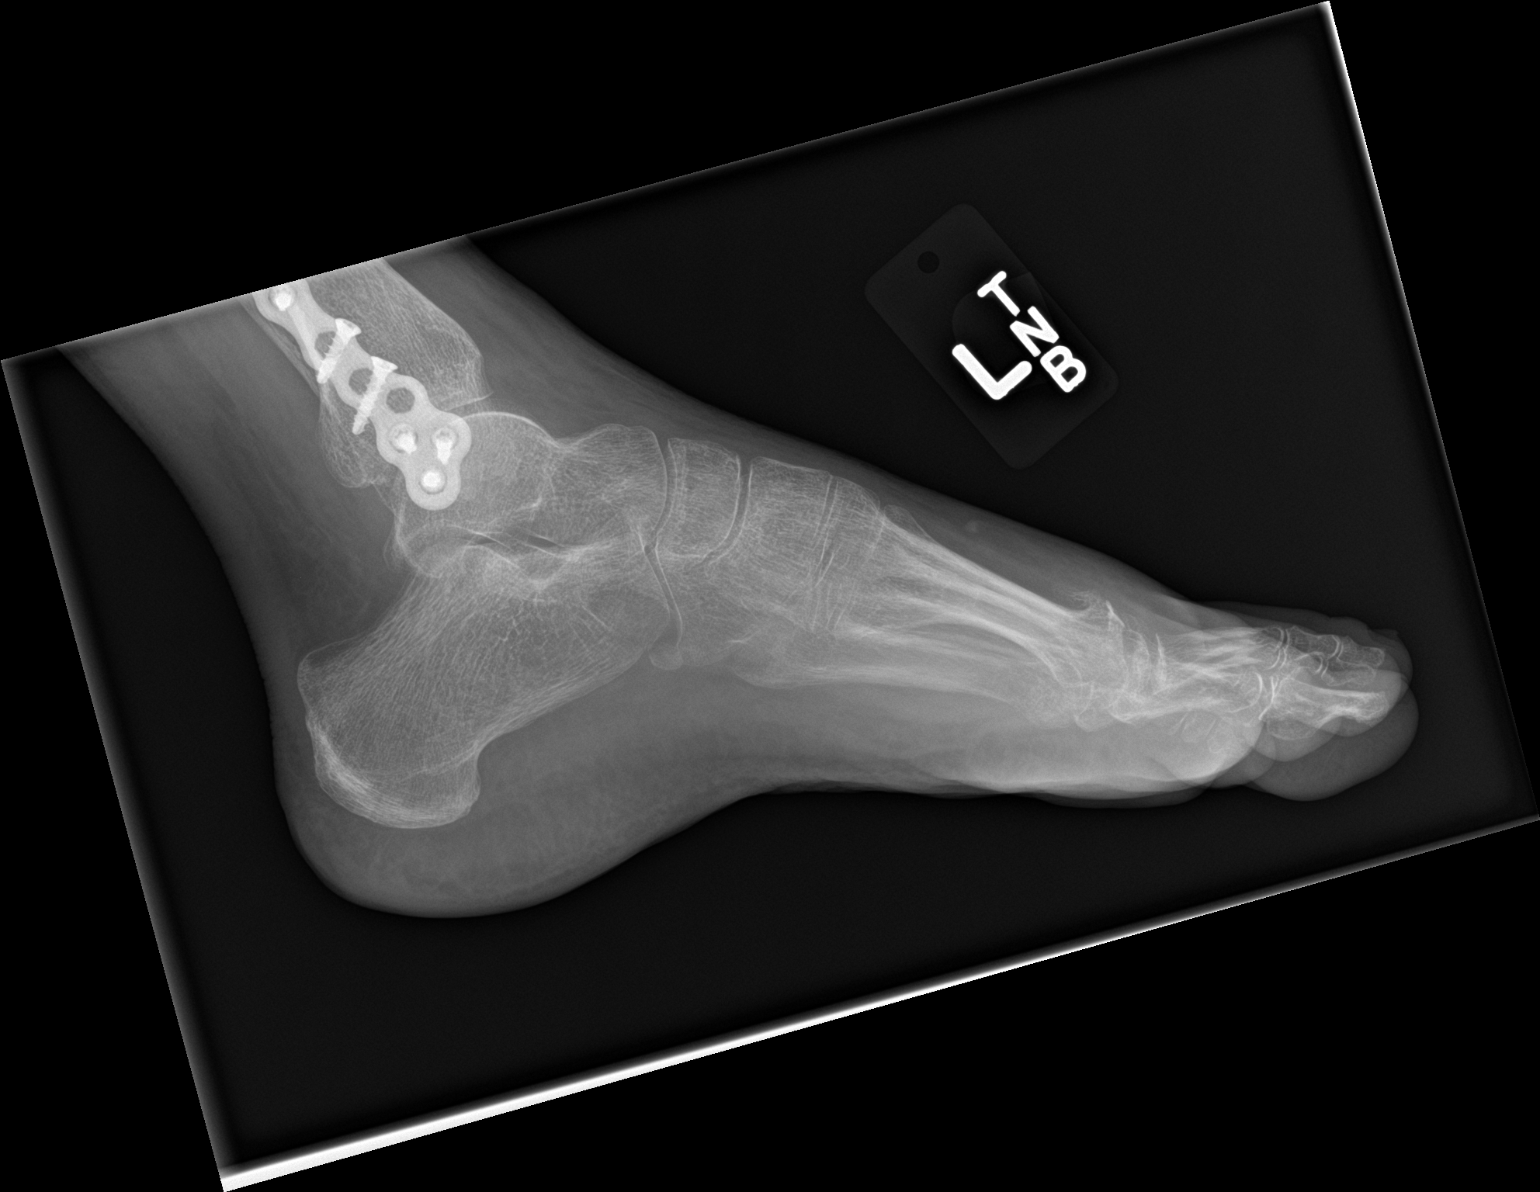

[3 of 3 positions shown; findings below may reference images not displayed]

FINDINGS: Nondisplaced fracture of the base of the distal phalanx of the left
great toe cannot be excluded. Diffuse osteopenia and degenerative
change. Plate and screw fixation distal fibula.
IMPRESSION: Nondisplaced fracture of the base of the distal phalanx of the left
great toe cannot be excluded.

## 2018-02-25 DIAGNOSIS — Z23 Encounter for immunization: Secondary | ICD-10-CM | POA: Diagnosis not present

## 2018-05-21 ENCOUNTER — Other Ambulatory Visit: Payer: Self-pay | Admitting: *Deleted

## 2018-05-21 ENCOUNTER — Ambulatory Visit (INDEPENDENT_AMBULATORY_CARE_PROVIDER_SITE_OTHER): Payer: Medicare Other | Admitting: Family Medicine

## 2018-05-21 ENCOUNTER — Ambulatory Visit (HOSPITAL_COMMUNITY)
Admission: RE | Admit: 2018-05-21 | Discharge: 2018-05-21 | Disposition: A | Discharge status: Home / Self Care | Payer: Medicare Other | Source: Ambulatory Visit | Attending: Family Medicine | Admitting: Family Medicine

## 2018-05-21 ENCOUNTER — Encounter: Payer: Self-pay | Admitting: Family Medicine

## 2018-05-21 ENCOUNTER — Other Ambulatory Visit (HOSPITAL_COMMUNITY)
Admission: RE | Admit: 2018-05-21 | Discharge: 2018-05-21 | Disposition: A | Discharge status: Home / Self Care | Payer: Medicare Other | Source: Ambulatory Visit | Attending: Family Medicine | Admitting: Family Medicine

## 2018-05-21 VITALS — BP 122/82 | Temp 97.9°F | Wt 99.2 lb

## 2018-05-21 DIAGNOSIS — I803 Phlebitis and thrombophlebitis of lower extremities, unspecified: Secondary | ICD-10-CM | POA: Diagnosis not present

## 2018-05-21 DIAGNOSIS — M79669 Pain in unspecified lower leg: Secondary | ICD-10-CM

## 2018-05-21 DIAGNOSIS — I809 Phlebitis and thrombophlebitis of unspecified site: Secondary | ICD-10-CM

## 2018-05-21 DIAGNOSIS — D696 Thrombocytopenia, unspecified: Secondary | ICD-10-CM

## 2018-05-21 DIAGNOSIS — R7989 Other specified abnormal findings of blood chemistry: Secondary | ICD-10-CM

## 2018-05-21 LAB — POTASSIUM: POTASSIUM: 3.8 mmol/L (ref 3.5–5.1)

## 2018-05-21 LAB — D-DIMER, QUANTITATIVE: D-Dimer, Quant: 0.84 ug/mL-FEU — ABNORMAL HIGH (ref 0.00–0.50)

## 2018-05-21 LAB — MAGNESIUM: MAGNESIUM: 2.3 mg/dL (ref 1.7–2.4)

## 2018-05-21 NOTE — Progress Notes (Signed)
   Subjective:    Patient ID: Jean Perez, female    DOB: 1956-04-25, 63 y.o.   MRN: [id number]  Leg Pain   Incident onset: going on for about a month. The pain is present in the left leg. She has tried elevation for the symptoms. The treatment provided mild relief.   Calf of left leg is tender to the touch, not red, not swollen. Pt states that she has had a blood clot before. Pt wakes up at least 3 times a week with cramp. Walking a few steps and the while driving since she is short legged her calf presses against the seat and that causing cramping. Pt states massaging helps the cramp.   Pt had an ankle fx in 2008  Ha a blood clot  supeficial in natur  Got baby aspirin for this   Now having symtoms similar to then    Waking up with cramping in the calf  maassagin g helps the pain   Aching a bi  About a month  No chang e ina ctivity evel one moths ago    Pt not working at a job, walking reg daily  More of a leisure walk            Review of Systems No headache, no major weight loss or weight gain, no chest pain no back pain abdominal pain no change in bowel habits complete ROS otherwise negative     Objective:   Physical Exam Alert and oriented, vitals reviewed and stable, NAD ENT-TM's and ext canals WNL bilat via otoscopic exam Soft palate, tonsils and post pharynx WNL via oropharyngeal exam Neck-symmetric, no masses; thyroid nonpalpable and nontender Pulmonary-no tachypnea or accessory muscle use; Clear without wheezes via auscultation Card--no abnrml murmurs, rhythm reg and rate WNL Carotid pulses symmetric, without bruits Left leg pulses excellent.  Sensation intact.  Calf pain to deep palpation.  Negative Homans sign.  Knee and ankle within normal limits.  No peripheral edema   impression leg pain calf, several weeks duration, accompanied by substantial cramps at night.  Also accompanied by history of superficial thrombophlebitis and patient  understandably concerned about clot.  Long discussion held.  We will do ultrasound plus blood work.  Further recommendations based on results. Greater than 50% of this 25 minute face to face visit was spent in counseling and discussion and coordination of care regarding the above diagnosis/diagnosies       Assessment & Plan:

## 2018-05-23 ENCOUNTER — Other Ambulatory Visit: Payer: Self-pay | Admitting: Neurology

## 2018-05-26 ENCOUNTER — Telehealth: Payer: Self-pay | Admitting: *Deleted

## 2018-05-26 ENCOUNTER — Ambulatory Visit (HOSPITAL_COMMUNITY)
Admission: RE | Admit: 2018-05-26 | Discharge: 2018-05-26 | Disposition: A | Discharge status: Home / Self Care | Payer: Medicare Other | Source: Ambulatory Visit | Attending: Family Medicine | Admitting: Family Medicine

## 2018-05-26 DIAGNOSIS — R7989 Other specified abnormal findings of blood chemistry: Secondary | ICD-10-CM | POA: Diagnosis not present

## 2018-05-26 DIAGNOSIS — M79652 Pain in left thigh: Secondary | ICD-10-CM | POA: Diagnosis not present

## 2018-05-26 DIAGNOSIS — M79669 Pain in unspecified lower leg: Secondary | ICD-10-CM | POA: Diagnosis not present

## 2018-05-26 NOTE — Telephone Encounter (Signed)
See result note.  

## 2018-05-26 NOTE — Telephone Encounter (Signed)
Repeat stat ultrasound results available in epic.

## 2018-05-27 ENCOUNTER — Ambulatory Visit: Payer: Medicare Other | Admitting: Family Medicine

## 2018-06-14 ENCOUNTER — Other Ambulatory Visit: Payer: Self-pay | Admitting: Family Medicine

## 2018-06-14 NOTE — Telephone Encounter (Signed)
Pt is requesting refill on gabapentin (NEURONTIN) 400 MG capsule and citalopram (CELEXA) 20 MG tablet. Pt was getting from Neuro but that doctor stated Dr. Steve could take these medications over.   Please send to [id number] - [place], [place] - [address] 

## 2018-06-16 ENCOUNTER — Telehealth: Payer: Self-pay | Admitting: Family Medicine

## 2018-06-16 NOTE — Telephone Encounter (Signed)
Pt is requesting refill on gabapentin (NEURONTIN) 400 MG capsule and citalopram (CELEXA) 20 MG tablet. Pt was getting from Neuro but that doctor stated Dr. Brett Canales could take these medications over.   Please send to [id number] - [place], [place] - [address]

## 2018-06-16 NOTE — Telephone Encounter (Signed)
Pt is calling checking the status of refill.

## 2018-06-16 NOTE — Telephone Encounter (Signed)
Please see tel enc from 06/14/18

## 2018-06-17 MED ORDER — GABAPENTIN 400 MG PO CAPS: 400.0000 mg | ORAL_CAPSULE | Freq: Two times a day (BID) | ORAL | 1 refills | Status: DC

## 2018-06-17 MED ORDER — CITALOPRAM HYDROBROMIDE 20 MG PO TABS: 20.0000 mg | ORAL_TABLET | Freq: Every day | ORAL | 1 refills | Status: DC

## 2018-06-17 NOTE — Telephone Encounter (Signed)
Meds sent. I need to call the pt.

## 2018-06-17 NOTE — Telephone Encounter (Signed)
May ref both timers six mos , has hx of regional reflex dystropphy pain, stable oln meds

## 2018-06-17 NOTE — Telephone Encounter (Signed)
Pt.notified

## 2018-06-22 ENCOUNTER — Other Ambulatory Visit: Payer: Self-pay

## 2018-06-22 MED ORDER — CITALOPRAM HYDROBROMIDE 20 MG PO TABS: 20.0000 mg | ORAL_TABLET | Freq: Every day | ORAL | 1 refills | Status: DC

## 2018-06-22 MED ORDER — GABAPENTIN 400 MG PO CAPS: 400.0000 mg | ORAL_CAPSULE | Freq: Two times a day (BID) | ORAL | 1 refills | Status: DC

## 2018-06-22 NOTE — Telephone Encounter (Signed)
Pt returned call and verbalized understanding  

## 2018-06-22 NOTE — Telephone Encounter (Signed)
Six month of both meds, let her know if hand worsens will need to go back to specialist

## 2018-06-22 NOTE — Telephone Encounter (Signed)
Medications sent in. Left message to return call  

## 2018-06-22 NOTE — Telephone Encounter (Signed)
Pt is calling about the prescriptions. Pt states she has been calling for a while about this.

## 2018-12-10 ENCOUNTER — Other Ambulatory Visit: Payer: Self-pay | Admitting: Family Medicine

## 2018-12-10 NOTE — Telephone Encounter (Signed)
Ok times one sched virt visit

## 2018-12-11 ENCOUNTER — Other Ambulatory Visit: Payer: Self-pay | Admitting: Family Medicine

## 2019-03-01 DIAGNOSIS — Z23 Encounter for immunization: Secondary | ICD-10-CM | POA: Diagnosis not present

## 2019-06-09 ENCOUNTER — Other Ambulatory Visit: Payer: Self-pay | Admitting: Family Medicine

## 2019-06-09 NOTE — Telephone Encounter (Signed)
Left message to return call 

## 2019-06-09 NOTE — Telephone Encounter (Signed)
Called and discussed with pt and she scheduled visit. Refill sent

## 2019-06-09 NOTE — Telephone Encounter (Signed)
Call pt, sched appt to disc uss this specifically within next three weeks, explain to her we have ot tackled these issues for the past yr and we have to document her ongoing need for the meds, and response, etc. Then may ref times one, virt fine

## 2019-06-09 NOTE — Telephone Encounter (Signed)
Went back to 2016 and could not find a med check was seen this month for leg pain.

## 2019-06-16 ENCOUNTER — Encounter: Payer: Self-pay | Admitting: Family Medicine

## 2019-06-21 ENCOUNTER — Ambulatory Visit (INDEPENDENT_AMBULATORY_CARE_PROVIDER_SITE_OTHER): Payer: Medicare Other | Admitting: Family Medicine

## 2019-06-21 ENCOUNTER — Encounter: Payer: Self-pay | Admitting: Family Medicine

## 2019-06-21 DIAGNOSIS — F411 Generalized anxiety disorder: Secondary | ICD-10-CM | POA: Diagnosis not present

## 2019-06-21 DIAGNOSIS — G9050 Complex regional pain syndrome I, unspecified: Secondary | ICD-10-CM

## 2019-06-21 MED ORDER — GABAPENTIN 400 MG PO CAPS: ORAL_CAPSULE | ORAL | 1 refills | Status: DC

## 2019-06-21 MED ORDER — ESCITALOPRAM OXALATE 20 MG PO TABS: ORAL_TABLET | ORAL | 1 refills | Status: DC

## 2019-06-21 NOTE — Progress Notes (Signed)
   Subjective:  Audio only  Patient ID: Jean Perez, female    DOB: 1955/08/15, 64 y.o.   MRN: [id number]  HPI Pt here for med check. Pt is taking Celexa 20 mg daily. Pt states she is doing well considering everything going on. PHQ9 completed.  Virtual Visit via Telephone Note  I connected with Jean Perez on 06/21/19 at  8:30 AM EST by telephone and verified that I am speaking with the correct person using two identifiers.  Location: Patient: home Provider: office   I discussed the limitations, risks, security and privacy concerns of performing an evaluation and management service by telephone and the availability of in person appointments. I also discussed with the patient that there may be a patient responsible charge related to this service. The patient expressed understanding and agreed to proceed.   History of Present Illness:    Observations/Objective:   Assessment and Plan:   Follow Up Instructions:    I discussed the assessment and treatment plan with the patient. The patient was provided an opportunity to ask questions and all were answered. The patient agreed with the plan and demonstrated an understanding of the instructions.   The patient was advised to call back or seek an in-person evaluation if the symptoms worsen or if the condition fails to improve as anticipated.  I provided 22 minutes of non-face-to-face time during this encounter.   Patient has history of regional complex pain syndrome.  Gabapentin 400 twice daily definitely helps it.   Patient also has history of chronic anxiety with an element of depression.  States overall did well till this year.  Now having more anxiety on the Celexa 20.  Stressed out due to issues with the coronavirus understandably  Review of Systems No headache no chest pain or shortness of breath    Objective:   Physical Exam  Virtual      Assessment & Plan:  Impression complex regional pain syndrome gabapentin  400 mg twice daily symptom care local measures discussed  2.  Mood disorder suboptimal control on Celexa encouraged to switch to Lexapro.  6 months worth of medications written follow-up in 6 months diet exercise discussed and encouraged

## 2019-08-25 DIAGNOSIS — Z23 Encounter for immunization: Secondary | ICD-10-CM | POA: Diagnosis not present

## 2019-09-22 DIAGNOSIS — Z23 Encounter for immunization: Secondary | ICD-10-CM | POA: Diagnosis not present

## 2019-10-14 ENCOUNTER — Other Ambulatory Visit: Payer: Self-pay

## 2019-10-14 ENCOUNTER — Encounter: Payer: Self-pay | Admitting: Emergency Medicine

## 2019-10-14 ENCOUNTER — Ambulatory Visit (INDEPENDENT_AMBULATORY_CARE_PROVIDER_SITE_OTHER): Payer: Medicare Other

## 2019-10-14 ENCOUNTER — Ambulatory Visit
Admission: EM | Admit: 2019-10-14 | Discharge: 2019-10-14 | Disposition: A | Discharge status: Home / Self Care | Payer: Medicare Other | Attending: Emergency Medicine | Admitting: Emergency Medicine

## 2019-10-14 DIAGNOSIS — M25572 Pain in left ankle and joints of left foot: Secondary | ICD-10-CM | POA: Diagnosis not present

## 2019-10-14 MED ORDER — PREDNISONE 10 MG PO TABS
20.0000 mg | ORAL_TABLET | Freq: Every day | ORAL | 0 refills | Status: AC
Start: 2019-10-14 — End: 2019-10-19

## 2019-10-14 NOTE — ED Triage Notes (Signed)
Swelling to LT ankle that started Tuesday, pt had ankle injury in 2018 and has metal plates in place.  Pt has been elevating her leg and it is not helping.

## 2019-10-14 NOTE — Discharge Instructions (Addendum)
Your left ankle x-ray is negative for bony abnormality including fracture or dislocation. Short-term prednisone was prescribed Take OTC Tylenol as needed for pain Follow-up and establish care with PCP Return or go to ED for worsening of symptoms

## 2019-10-14 NOTE — ED Provider Notes (Signed)
[facility]    CSN: [id number] Arrival date & time: 10/14/19  1502      History   Chief Complaint Chief Complaint  Patient presents with  . Ankle Pain    HPI Jean Perez is a 64 y.o. female.   Who presented to the urgent care with a complaint of left ankle pain and swelling that started 3 to 4 days.  Reports swelling is now resolved.  Denies any precipitating event.  Located pain at the site of old ankle surgery where hardplate has been inserted.  Described the pain as constant and achy, rated at 6 on a scale of 1-10.  Has tried OTC medication without relief.  Symptoms are made worse with range of motion.  Denies similar symptoms in the past.  Denies chills, fever, nausea, vomiting, diarrhea, recent trauma and/or injury.   The history is provided by the patient. No language interpreter was used.  Ankle Pain   Past Medical History:  Diagnosis Date  . Brachial neuritis or radiculitis NOS   . Depression   . Herniated cervical disc   . Memory loss   . 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 Depression 02/16/2017  . Left arm pain 02/10/2013    Past Surgical History:  Procedure Laterality Date  . arm surgery    . CARPAL TUNNEL RELEASE    . left elbow      OB History   No obstetric history on file.      Home Medications    Prior to Admission medications   Medication Sig Start Date End Date Taking? Authorizing Provider  calcium-vitamin D (OSCAL WITH D) 500-200 MG-UNIT per tablet Take 1 tablet by mouth daily.    [provider]  escitalopram (LEXAPRO) 20 MG tablet Take one tablet po each morning 06/21/19   Merlyn Albert, MD  gabapentin (NEURONTIN) 400 MG capsule TAKE 1 CAPSULE(400 MG) BY MOUTH TWICE DAILY 06/21/19   Merlyn Albert, MD  lidocaine (LIDODERM) 5 % Place 1 patch onto the skin daily. Remove & Discard patch within 12 hours or as directed by MD Patient taking  differently: Place 1 patch onto the skin daily as needed. Remove & Discard patch within 12 hours or as directed by MD 02/10/14   Levert Feinstein, MD  predniSONE (DELTASONE) 10 MG tablet Take 2 tablets (20 mg total) by mouth daily for 5 days. 10/14/19 10/19/19  Durward Parcel, FNP  PRESCRIPTION MEDICATION Apply 1 application topically daily as needed (for pain). PT GETS A COMPOUND PRESCRIPTION AT [facility]. KETAMINE 10% LIDOCAINE 5% AMITRIPALINE 2%     [provider]  UNABLE TO FIND TENS UNIT    [provider]  Vitamin D, Ergocalciferol, (DRISDOL) 50000 UNITS CAPS Take 50,000 Units by mouth every Friday.     [provider]    Family History Family History  Problem Relation Age of Onset  . Stroke Other   . Heart disease Other   . High blood pressure Other     Social History Social History   Tobacco Use  . Smoking status: Current Every Day Smoker    Packs/day: 0.50    Types: Cigarettes  . Smokeless tobacco: Never Used  Substance Use Topics  . Alcohol use: No    Alcohol/week: 0.0 standard drinks  . Drug use: No     Allergies   Penicillins   Review of  Systems Review of Systems  Respiratory: Negative.   Cardiovascular: Negative.   Musculoskeletal: Positive for arthralgias.  All other systems reviewed and are negative.    Physical Exam Triage Vital Signs ED Triage Vitals [10/14/19 1621]  Enc Vitals Group     BP      Pulse      Resp      Temp      Temp src      SpO2      Weight 99 lb 3.3 oz (45 kg)     Height 4\' 10"  (1.473 m)     Head Circumference      Peak Flow      Pain Score      Pain Loc      Pain Edu?      Excl. in GC?    No data found.  Updated Vital Signs Ht 4\' 10"  (1.473 m)   Wt 99 lb 3.3 oz (45 kg)   LMP  (LMP Unknown)   BMI 20.73 kg/m   Visual Acuity Right Eye Distance:   Left Eye Distance:   Bilateral Distance:    Right Eye Near:   Left Eye Near:    Bilateral Near:     Physical Exam Vitals and nursing note  reviewed.  Constitutional:      General: She is not in acute distress.    Appearance: Normal appearance. She is normal weight. She is not ill-appearing, toxic-appearing or diaphoretic.  Cardiovascular:     Rate and Rhythm: Normal rate and regular rhythm.     Pulses: Normal pulses.     Heart sounds: Normal heart sounds. No murmur. No friction rub. No gallop.   Pulmonary:     Effort: Pulmonary effort is normal. No respiratory distress.     Breath sounds: Normal breath sounds. No stridor. No wheezing, rhonchi or rales.  Chest:     Chest wall: No tenderness.  Musculoskeletal:        General: Tenderness present.     Right ankle: Normal.     Left ankle: No swelling. Tenderness present.     Comments: Patient is able to bear weight and ambulate with mild pain.  No surface trauma, ecchymosis, erythema, lesion, ulcer or break in skin integrity.  The left ankle/foot is without obvious asymmetry when compared to the right ankle/foot.  Normal plantar dorsiflexion, inversion/eversion.  Neurovascular status intact.  Pain with massage of calf  Neurological:     Mental Status: She is alert.      UC Treatments / Results  Labs (all labs ordered are listed, but only abnormal results are displayed) Labs Reviewed - No data to display  EKG   Radiology No results found.  Procedures Procedures (including critical care time)  Medications Ordered in UC Medications - No data to display  Initial Impression / Assessment and Plan / UC Course  I have reviewed the triage vital signs and the nursing notes.  Pertinent labs & imaging results that were available during my care of the patient were reviewed by me and considered in my medical decision making (see chart for details).     Patient is stable for discharge.  Left ankle x-ray is negative for bony abnormality including fracture or dislocation.  I have reviewed the x-ray myself and the radiologist interpretation.  I am in agreement with the  radiologist interpretation.  There is a concern of thrombosis as patient has been complaining of pain on calf massage.  She was advised to follow-up  with PCP for further evaluation in order to rule out blood clot.  To go to ED for worsening symptoms.  5 days course of prednisone were prescribed.  Final Clinical Impressions(s) / UC Diagnoses   Final diagnoses:  Acute left ankle pain     Discharge Instructions     Your left ankle x-ray is negative for bony abnormality including fracture or dislocation. Short-term prednisone was prescribed Take OTC Tylenol as needed for pain Follow-up and establish care with PCP Return or go to ED for worsening of symptoms    ED Prescriptions    Medication Sig Dispense Auth. Provider   predniSONE (DELTASONE) 10 MG tablet Take 2 tablets (20 mg total) by mouth daily for 5 days. 10 tablet Shirlie Enck, Darrelyn Hillock, FNP     PDMP not reviewed this encounter.   Emerson Monte, [place] 10/14/19 1815

## 2019-12-23 ENCOUNTER — Other Ambulatory Visit: Payer: Self-pay | Admitting: Family Medicine

## 2019-12-26 NOTE — Telephone Encounter (Signed)
Scheduled 9/9

## 2020-01-19 ENCOUNTER — Ambulatory Visit: Payer: Medicare Other | Admitting: Family Medicine

## 2020-02-02 ENCOUNTER — Ambulatory Visit (INDEPENDENT_AMBULATORY_CARE_PROVIDER_SITE_OTHER): Payer: Medicare Other | Admitting: Family Medicine

## 2020-02-02 ENCOUNTER — Other Ambulatory Visit: Payer: Self-pay

## 2020-02-02 ENCOUNTER — Encounter: Payer: Self-pay | Admitting: Family Medicine

## 2020-02-02 VITALS — BP 122/74 | HR 94 | Temp 97.4°F | Ht <= 58 in | Wt 99.4 lb

## 2020-02-02 DIAGNOSIS — Z13220 Encounter for screening for lipoid disorders: Secondary | ICD-10-CM | POA: Diagnosis not present

## 2020-02-02 DIAGNOSIS — G90512 Complex regional pain syndrome I of left upper limb: Secondary | ICD-10-CM

## 2020-02-02 DIAGNOSIS — Z131 Encounter for screening for diabetes mellitus: Secondary | ICD-10-CM

## 2020-02-02 DIAGNOSIS — Z130 Encounter for screening for diseases of the blood and blood-forming organs and certain disorders involving the immune mechanism: Secondary | ICD-10-CM

## 2020-02-02 DIAGNOSIS — F419 Anxiety disorder, unspecified: Secondary | ICD-10-CM

## 2020-02-02 DIAGNOSIS — Z1329 Encounter for screening for other suspected endocrine disorder: Secondary | ICD-10-CM | POA: Diagnosis not present

## 2020-02-02 DIAGNOSIS — F32A Depression, unspecified: Secondary | ICD-10-CM

## 2020-02-02 DIAGNOSIS — F329 Major depressive disorder, single episode, unspecified: Secondary | ICD-10-CM

## 2020-02-02 MED ORDER — GABAPENTIN 400 MG PO CAPS: ORAL_CAPSULE | ORAL | 1 refills | Status: DC

## 2020-02-02 MED ORDER — ESCITALOPRAM OXALATE 20 MG PO TABS: ORAL_TABLET | ORAL | 0 refills | Status: DC

## 2020-02-02 NOTE — Progress Notes (Signed)
Patient ID: Jean Perez, female    DOB: [birth year], 64 y.o.   MRN: [id number]   Chief Complaint  Patient presents with  . Anxiety   Subjective:    HPI  Pt here for medication follow up for anxiety.  Pt is taking Lexapro 20 mg once a day and Gabapentin 400 mg BID.   Pt has family out of town and worried about her family.  Called pt in 2/21 for phone visit for follow up on anxiety. 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 Had 2 surgeries, first surgery 2008. Had a fall from step at home about 3 feet from ground. discloated the elbow, when reducing elbow had fracture and then referred to ortho. Then went to anesthesia and did ganglion block in neck.  Taking gabapentin 461m bid. And ganglion blocks in left neck in past to get some relief.  Has pinched neck and  Herniated disc at C6 and C7. Walking she can feel it. Left trapezius and radiating down the left arm. Paresthesia in arm constantly.   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   Outpatient Encounter Medications as of 02/02/2020  Medication Sig  . calcium-vitamin D (OSCAL WITH D) 500-200 MG-UNIT per tablet Take 1 tablet by mouth daily.  .Marland Kitchenescitalopram (LEXAPRO) 20 MG tablet TAKE 1 TABLET BY MOUTH EVERY MORNING  . gabapentin (NEURONTIN) 400 MG capsule TAKE 1 CAPSULE(400 MG) BY MOUTH TWICE DAILY  . lidocaine (LIDODERM) 5 % Place 1 patch onto the skin daily. Remove & Discard patch within 12 hours or as directed by MD (Patient taking differently: Place 1 patch onto the skin daily as needed. Remove & Discard patch within 12 hours or as directed by MD)  . PRESCRIPTION MEDICATION Apply 1 application topically daily as needed (for pain). PT GETS A COMPOUND PRESCRIPTION AT [facility]. KETAMINE 10% LIDOCAINE 5% AMITRIPALINE 2%   . UNABLE TO FIND TENS UNIT  . Vitamin D,  Ergocalciferol, (DRISDOL) 50000 UNITS CAPS Take 50,000 Units by mouth every Friday.   . [DISCONTINUED] escitalopram (LEXAPRO) 20 MG tablet TAKE 1 TABLET BY MOUTH EVERY MORNING  . [DISCONTINUED] gabapentin (NEURONTIN) 400 MG capsule TAKE 1 CAPSULE(400 MG) BY MOUTH TWICE DAILY   No facility-administered encounter medications on file as of 02/02/2020.     Review of Systems  Constitutional: Negative for chills and fever.  HENT: Negative for congestion, rhinorrhea and sore throat.   Respiratory: Negative for cough, shortness of breath and wheezing.   Cardiovascular: Negative for chest pain and leg swelling.  Gastrointestinal: Negative for abdominal pain, diarrhea, nausea and vomiting.  Genitourinary: Negative for dysuria and frequency.  Musculoskeletal: Negative for arthralgias and back pain.  Skin: Negative for rash.  Neurological: Negative for dizziness, weakness and headaches.     Vitals BP 122/74   Pulse 94   Temp (!) 97.4 F (36.3 C)   Ht '4\' 10"'  (1.473 m)   Wt 99 lb 6.4 oz (45.1 kg)   LMP  (LMP Unknown)   SpO2 94%   BMI 20.77 kg/m   Objective:   Physical Exam Vitals and nursing note reviewed.  Constitutional:      Appearance: Normal appearance.  HENT:     Head: Normocephalic and atraumatic.     Nose: Nose normal.     Mouth/Throat:     Mouth: Mucous membranes are moist.  Pharynx: Oropharynx is clear.  Eyes:     Extraocular Movements: Extraocular movements intact.     Conjunctiva/sclera: Conjunctivae normal.     Pupils: Pupils are equal, round, and reactive to light.  Cardiovascular:     Rate and Rhythm: Regular rhythm. Tachycardia present.     Pulses: Normal pulses.     Heart sounds: Normal heart sounds.  Pulmonary:     Effort: Pulmonary effort is normal.     Breath sounds: Normal breath sounds. No wheezing, rhonchi or rales.  Musculoskeletal:        General: Normal range of motion.     Right lower leg: No edema.     Left lower leg: No edema.  Skin:     General: Skin is warm and dry.     Findings: No lesion or rash.  Neurological:     General: No focal deficit present.     Mental Status: She is alert and oriented to person, place, and time.  Psychiatric:        Behavior: Behavior normal.     Comments: +anxious mood and affect      Assessment and Plan   1. Depression, unspecified depression type - escitalopram (LEXAPRO) 20 MG tablet; TAKE 1 TABLET BY MOUTH EVERY MORNING  Dispense: 30 tablet; Refill: 0  2. Anxiety - escitalopram (LEXAPRO) 20 MG tablet; TAKE 1 TABLET BY MOUTH EVERY MORNING  Dispense: 30 tablet; Refill: 0  3. Thyroid disorder screen - TSH  4. Diabetes mellitus screening - CMP14+EGFR  5. Screening for deficiency anemia  6. Lipid screening - Lipid panel  7. 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  Cont lexpro. RSD- stable, cont gabapentin.   Labs ordered for next visit.  F/u [place] prn.

## 2020-02-07 DIAGNOSIS — Z23 Encounter for immunization: Secondary | ICD-10-CM | POA: Diagnosis not present

## 2020-02-15 ENCOUNTER — Encounter: Payer: Self-pay | Admitting: Family Medicine

## 2020-02-15 DIAGNOSIS — F419 Anxiety disorder, unspecified: Secondary | ICD-10-CM | POA: Insufficient documentation

## 2020-02-15 DIAGNOSIS — G90512 Complex regional pain syndrome I of left upper limb: Secondary | ICD-10-CM | POA: Insufficient documentation

## 2020-03-14 ENCOUNTER — Other Ambulatory Visit: Payer: Self-pay | Admitting: Family Medicine

## 2020-03-14 DIAGNOSIS — F32A Depression, unspecified: Secondary | ICD-10-CM

## 2020-03-14 DIAGNOSIS — F419 Anxiety disorder, unspecified: Secondary | ICD-10-CM

## 2020-05-03 DIAGNOSIS — Z23 Encounter for immunization: Secondary | ICD-10-CM | POA: Diagnosis not present

## 2020-08-22 ENCOUNTER — Other Ambulatory Visit: Payer: Self-pay | Admitting: Family Medicine

## 2020-08-22 DIAGNOSIS — G90512 Complex regional pain syndrome I of left upper limb: Secondary | ICD-10-CM

## 2020-08-22 NOTE — Telephone Encounter (Signed)
Needs f/u appt in next 30 days on this medication. Gave 30 day supply.  Thx. Dr. Ladona Ridgel

## 2020-08-30 NOTE — Telephone Encounter (Signed)
No answer

## 2020-10-22 ENCOUNTER — Telehealth: Payer: Self-pay | Admitting: Family Medicine

## 2020-10-22 DIAGNOSIS — G90512 Complex regional pain syndrome I of left upper limb: Secondary | ICD-10-CM

## 2020-10-23 NOTE — Telephone Encounter (Signed)
Please schedule office visit and return to nurses. Thanks  

## 2020-10-24 NOTE — Telephone Encounter (Signed)
Called twice-no answer

## 2020-10-25 MED ORDER — GABAPENTIN 400 MG PO CAPS: 400.0000 mg | ORAL_CAPSULE | Freq: Two times a day (BID) | ORAL | 0 refills | Status: DC

## 2020-10-25 NOTE — Telephone Encounter (Signed)
Patient has appointment 7/5 this will be her first time seeing Dr. Ladona Ridgel

## 2020-10-25 NOTE — Addendum Note (Signed)
Addended by: Alm Bustard R on: 10/25/2020 08:37 AM   Modules accepted: Orders

## 2020-10-25 NOTE — Telephone Encounter (Signed)
She was last seen 02/02/20

## 2020-10-25 NOTE — Telephone Encounter (Signed)
30 day supply of gabapentin sent to pharmacy.

## 2020-11-13 ENCOUNTER — Encounter: Payer: Self-pay | Admitting: Family Medicine

## 2020-11-13 ENCOUNTER — Ambulatory Visit (INDEPENDENT_AMBULATORY_CARE_PROVIDER_SITE_OTHER): Payer: Medicare Other | Admitting: Family Medicine

## 2020-11-13 ENCOUNTER — Other Ambulatory Visit: Payer: Self-pay

## 2020-11-13 DIAGNOSIS — F32A Depression, unspecified: Secondary | ICD-10-CM

## 2020-11-13 DIAGNOSIS — F419 Anxiety disorder, unspecified: Secondary | ICD-10-CM | POA: Diagnosis not present

## 2020-11-13 DIAGNOSIS — G90512 Complex regional pain syndrome I of left upper limb: Secondary | ICD-10-CM

## 2020-11-13 MED ORDER — GABAPENTIN 400 MG PO CAPS: 400.0000 mg | ORAL_CAPSULE | Freq: Two times a day (BID) | ORAL | 5 refills | Status: DC

## 2020-11-13 MED ORDER — ESCITALOPRAM OXALATE 20 MG PO TABS: 20.0000 mg | ORAL_TABLET | Freq: Every morning | ORAL | 5 refills | Status: DC

## 2020-11-13 NOTE — Progress Notes (Signed)
Patient ID: Jean Perez, female    DOB: 19-Oct-1955, 65 y.o.   MRN: [id number]   Chief Complaint  Patient presents with   Depression    Follow up   Subjective:    HPI F/u depression- No new concerns.  Pt taking lexapro 20mg .  Has been on this for years.  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 Comes and goes with neuroapthy. Taking gabapentin.   Pt was considering tapering off both of the medications gabapentin and lexapro, only bc she doesn't like coming to the office 2x per year.  The medications are stable and have been controlling symptoms for years.   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   Outpatient Encounter Medications as of 11/13/2020  Medication Sig   calcium-vitamin D (OSCAL WITH D) 500-200 MG-UNIT per tablet Take 1 tablet by mouth daily.   escitalopram (LEXAPRO) 20 MG tablet Take 1 tablet (20 mg total) by mouth every morning.   gabapentin (NEURONTIN) 400 MG capsule Take 1 capsule (400 mg total) by mouth 2 (two) times daily.   lidocaine (LIDODERM) 5 % Place 1 patch onto the skin daily. Remove & Discard patch within 12 hours or as directed by MD (Patient taking differently: Place 1 patch onto the skin daily as needed. Remove & Discard patch within 12 hours or as directed by MD)   PRESCRIPTION MEDICATION Apply 1 application topically daily as needed (for pain). PT GETS A COMPOUND PRESCRIPTION AT [facility]USTOM [facility]ARE. KETAMINE 10% LIDOCAINE 5% AMITRIPALINE 2%    UNABLE TO FIND TENS UNIT   Vitamin D, Ergocalciferol, (DRISDOL) 50000 UNITS CAPS Take 50,000 Units by mouth every Friday.    [DISCONTINUED] escitalopram (LEXAPRO) 20 MG tablet TAKE 1 TABLET BY MOUTH EVERY MORNING   [DISCONTINUED] gabapentin (NEURONTIN) 400 MG capsule Take 1 capsule (400 mg total) by mouth 2 (two) times daily.   No  facility-administered encounter medications on file as of 11/13/2020.     Review of Systems  Constitutional:  Negative for chills and fever.  HENT:  Negative for congestion, rhinorrhea and sore throat.   Respiratory:  Negative for cough, shortness of breath and wheezing.   Cardiovascular:  Negative for chest pain and leg swelling.  Gastrointestinal:  Negative for abdominal pain, diarrhea, nausea and vomiting.  Genitourinary:  Negative for dysuria and frequency.  Musculoskeletal:  Negative for arthralgias and back pain.  Skin:  Negative for rash.  Neurological:  Negative for dizziness, weakness and headaches.       +chronic left forearm/elbow pain/neuropathy  Psychiatric/Behavioral:  Negative for dysphoric mood, self-injury, sleep disturbance and suicidal ideas. The patient is not nervous/anxious.     Vitals BP 116/76   Pulse 61   Temp 97.7 F (36.5 C) (Oral)   Ht 4\' 10"  (1.473 m)   Wt 99 lb (44.9 kg)   LMP  (LMP Unknown)   SpO2 98%   BMI 20.69 kg/m   Objective:   Physical Exam Vitals and nursing note reviewed.  Constitutional:      General: She is not in acute distress.    Appearance: Normal appearance.  HENT:     Head: Normocephalic and atraumatic.  Cardiovascular:     Rate and Rhythm: Normal rate and regular rhythm.     Pulses: Normal pulses.     Heart sounds: Normal heart sounds.  Pulmonary:  Effort: Pulmonary effort is normal.     Breath sounds: Normal breath sounds. No wheezing, rhonchi or rales.  Musculoskeletal:        General: Normal range of motion.     Right lower leg: No edema.     Left lower leg: No edema.  Skin:    General: Skin is warm and dry.     Findings: No lesion or rash.  Neurological:     General: No focal deficit present.     Mental Status: She is alert and oriented to person, place, and time.     Cranial Nerves: No cranial nerve deficit.  Psychiatric:        Mood and Affect: Mood normal.        Behavior: Behavior normal.         Thought Content: Thought content normal.        Judgment: Judgment normal.     Assessment and Plan   1. Depression, unspecified depression type - escitalopram (LEXAPRO) 20 MG tablet; Take 1 tablet (20 mg total) by mouth every morning.  Dispense: 30 tablet; Refill: 5  2. Anxiety - escitalopram (LEXAPRO) 20 MG tablet; Take 1 tablet (20 mg total) by mouth every morning.  Dispense: 30 tablet; Refill: 5  3. 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  Dispense: 60 capsule; Refill: 5   Depression/anxiety-stable. cont meds. Neuropathy/RSD-stable. Pt doing well on meds.  Discussed she may want to taper off the lexapro and gabapentin.  Gave refills and advised pt to think about it and if wanting tapering schedule to call us back.    Reviewed need for pt to f/u 2x per year for refills on these meds.  Pt voiced understanding.  Return in about 6 months (around 05/16/2021) for f/u anxiety, depression, RSD.

## 2021-01-17 ENCOUNTER — Other Ambulatory Visit: Payer: Self-pay

## 2021-01-17 ENCOUNTER — Ambulatory Visit (INDEPENDENT_AMBULATORY_CARE_PROVIDER_SITE_OTHER): Payer: Medicare Other

## 2021-01-17 ENCOUNTER — Encounter (INDEPENDENT_AMBULATORY_CARE_PROVIDER_SITE_OTHER): Payer: Self-pay | Admitting: *Deleted

## 2021-01-17 ENCOUNTER — Encounter (INDEPENDENT_AMBULATORY_CARE_PROVIDER_SITE_OTHER): Payer: Self-pay

## 2021-01-17 ENCOUNTER — Encounter: Payer: Self-pay | Admitting: Internal Medicine

## 2021-01-17 ENCOUNTER — Ambulatory Visit (INDEPENDENT_AMBULATORY_CARE_PROVIDER_SITE_OTHER): Payer: Medicare Other | Admitting: Internal Medicine

## 2021-01-17 VITALS — BP 132/82 | HR 95 | Temp 98.4°F | Resp 18 | Ht <= 58 in | Wt 95.1 lb

## 2021-01-17 DIAGNOSIS — Z114 Encounter for screening for human immunodeficiency virus [HIV]: Secondary | ICD-10-CM | POA: Diagnosis not present

## 2021-01-17 DIAGNOSIS — Z1231 Encounter for screening mammogram for malignant neoplasm of breast: Secondary | ICD-10-CM

## 2021-01-17 DIAGNOSIS — F17210 Nicotine dependence, cigarettes, uncomplicated: Secondary | ICD-10-CM

## 2021-01-17 DIAGNOSIS — F32A Depression, unspecified: Secondary | ICD-10-CM | POA: Diagnosis not present

## 2021-01-17 DIAGNOSIS — F419 Anxiety disorder, unspecified: Secondary | ICD-10-CM

## 2021-01-17 DIAGNOSIS — Z124 Encounter for screening for malignant neoplasm of cervix: Secondary | ICD-10-CM

## 2021-01-17 DIAGNOSIS — G90512 Complex regional pain syndrome I of left upper limb: Secondary | ICD-10-CM | POA: Diagnosis not present

## 2021-01-17 DIAGNOSIS — Z7689 Persons encountering health services in other specified circumstances: Secondary | ICD-10-CM

## 2021-01-17 DIAGNOSIS — E559 Vitamin D deficiency, unspecified: Secondary | ICD-10-CM | POA: Diagnosis not present

## 2021-01-17 DIAGNOSIS — Z1211 Encounter for screening for malignant neoplasm of colon: Secondary | ICD-10-CM

## 2021-01-17 DIAGNOSIS — Z720 Tobacco use: Secondary | ICD-10-CM | POA: Diagnosis not present

## 2021-01-17 DIAGNOSIS — Z23 Encounter for immunization: Secondary | ICD-10-CM

## 2021-01-17 DIAGNOSIS — Z1159 Encounter for screening for other viral diseases: Secondary | ICD-10-CM | POA: Diagnosis not present

## 2021-01-17 DIAGNOSIS — E782 Mixed hyperlipidemia: Secondary | ICD-10-CM | POA: Diagnosis not present

## 2021-01-17 DIAGNOSIS — Z0000 Encounter for general adult medical examination without abnormal findings: Secondary | ICD-10-CM

## 2021-01-17 MED ORDER — MIRTAZAPINE 15 MG PO TABS: 15.0000 mg | ORAL_TABLET | Freq: Every day | ORAL | 3 refills | Status: DC

## 2021-01-17 NOTE — Assessment & Plan Note (Signed)
Had an injury of left elbow, needed multiple surgeries Has had cervical blocks in the past Well-controlled with Gabapentin 400 mg BID currently

## 2021-01-17 NOTE — Patient Instructions (Signed)
Please start taking Remeron instead of Lexapro.  Please continue to take Gabapentin as prescribed.  Please get fasting blood tests done before the next visit.

## 2021-01-17 NOTE — Assessment & Plan Note (Signed)
Care established History and medications reviewed with the patient 

## 2021-01-17 NOTE — Patient Instructions (Signed)
Health Maintenance, Female Adopting a healthy lifestyle and getting preventive care are important in promoting health and wellness. Ask your health care provider about: The right schedule for you to have regular tests and exams. Things you can do on your own to prevent diseases and keep yourself healthy. What should I know about diet, weight, and exercise? Eat a healthy diet  Eat a diet that includes plenty of vegetables, fruits, low-fat dairy products, and lean protein. Do not eat a lot of foods that are high in solid fats, added sugars, or sodium. Maintain a healthy weight Body mass index (BMI) is used to identify weight problems. It estimates body fat based on height and weight. Your health care provider can help determine your BMI and help you achieve or maintain a healthy weight. Get regular exercise Get regular exercise. This is one of the most important things you can do for your health. Most adults should: Exercise for at least 150 minutes each week. The exercise should increase your heart rate and make you sweat (moderate-intensity exercise). Do strengthening exercises at least twice a week. This is in addition to the moderate-intensity exercise. Spend less time sitting. Even light physical activity can be beneficial. Watch cholesterol and blood lipids Have your blood tested for lipids and cholesterol at 63 years of age, then have this test every 5 years. Have your cholesterol levels checked more often if: Your lipid or cholesterol levels are high. You are older than 63 years of age. You are at high risk for heart disease. What should I know about cancer screening? Depending on your health history and family history, you may need to have cancer screening at various ages. This may include screening for: Breast cancer. Cervical cancer. Colorectal cancer. Skin cancer. Lung cancer. What should I know about heart disease, diabetes, and high blood pressure? Blood pressure and heart  disease High blood pressure causes heart disease and increases the risk of stroke. This is more likely to develop in people who have high blood pressure readings, are of African descent, or are overweight. Have your blood pressure checked: Every 3-5 years if you are 18-39 years of age. Every year if you are 40 years old or older. Diabetes Have regular diabetes screenings. This checks your fasting blood sugar level. Have the screening done: Once every three years after age 40 if you are at a normal weight and have a low risk for diabetes. More often and at a younger age if you are overweight or have a high risk for diabetes. What should I know about preventing infection? Hepatitis B If you have a higher risk for hepatitis B, you should be screened for this virus. Talk with your health care provider to find out if you are at risk for hepatitis B infection. Hepatitis C Testing is recommended for: Everyone born from 1945 through 1965. Anyone with known risk factors for hepatitis C. Sexually transmitted infections (STIs) Get screened for STIs, including gonorrhea and chlamydia, if: You are sexually active and are younger than 63 years of age. You are older than 63 years of age and your health care provider tells you that you are at risk for this type of infection. Your sexual activity has changed since you were last screened, and you are at increased risk for chlamydia or gonorrhea. Ask your health care provider if you are at risk. Ask your health care provider about whether you are at high risk for HIV. Your health care provider may recommend a prescription medicine   to help prevent HIV infection. If you choose to take medicine to prevent HIV, you should first get tested for HIV. You should then be tested every 3 months for as long as you are taking the medicine. Pregnancy If you are about to stop having your period (premenopausal) and you may become pregnant, seek counseling before you get  pregnant. Take 400 to 800 micrograms (mcg) of folic acid every day if you become pregnant. Ask for birth control (contraception) if you want to prevent pregnancy. Osteoporosis and menopause Osteoporosis is a disease in which the bones lose minerals and strength with aging. This can result in bone fractures. If you are 65 years old or older, or if you are at risk for osteoporosis and fractures, ask your health care provider if you should: Be screened for bone loss. Take a calcium or vitamin D supplement to lower your risk of fractures. Be given hormone replacement therapy (HRT) to treat symptoms of menopause. Follow these instructions at home: Lifestyle Do not use any products that contain nicotine or tobacco, such as cigarettes, e-cigarettes, and chewing tobacco. If you need help quitting, ask your health care provider. Do not use street drugs. Do not share needles. Ask your health care provider for help if you need support or information about quitting drugs. Alcohol use Do not drink alcohol if: Your health care provider tells you not to drink. You are pregnant, may be pregnant, or are planning to become pregnant. If you drink alcohol: Limit how much you use to 0-1 drink a day. Limit intake if you are breastfeeding. Be aware of how much alcohol is in your drink. In the [place] one drink equals one 12 oz bottle of beer (355 mL), one 5 oz glass of wine (148 mL), or one 1 oz glass of hard liquor (44 mL). General instructions Schedule regular health, dental, and eye exams. Stay current with your vaccines. Tell your health care provider if: You often feel depressed. You have ever been abused or do not feel safe at home. Summary Adopting a healthy lifestyle and getting preventive care are important in promoting health and wellness. Follow your health care provider's instructions about healthy diet, exercising, and getting tested or screened for diseases. Follow your health care provider's  instructions on monitoring your cholesterol and blood pressure. This information is not intended to replace advice given to you by your health care provider. Make sure you discuss any questions you have with your health care provider. Document Revised: 07/06/2020 Document Reviewed: 04/21/2018 Elsevier Patient Education  2022 [employer]  

## 2021-01-17 NOTE — Progress Notes (Signed)
New Patient Office Visit  Subjective:  Patient ID: Jean Perez, female    DOB: 1955-11-25  Age: 65 y.o. MRN: [id number]  CC:  Chief Complaint  Patient presents with   New Patient (Initial Visit)    New patient was seeing dr Gerda Diss feels very stressed has a hard time sleeping she doesn't feel hungry she has lost 4lbs     HPI Jean Perez is a 65 y.o. female with past medical history of complex regional pain syndrome of left UE, depression with anxiety and tobacco abuse who presents for establishing care.  She is a former patient of Dr. Lelon Frohlich. Ladona Ridgel.  She has been feeling depressed and anxious.  She is on Lexapro 20 Mg daily currently.  She used to take citalopram in the past, but chart review suggest that she was having suboptimal response.  She also reports decreased appetite and insomnia.  She denies any SI or HI.  She attributes her anxiety symptoms to complex regional pain syndrome.  She has a history of a fall in 2008, and had elbow injury from it.  She had multiple surgeries for it, and had cervical block for complex regional pain syndrome of left UE.  Her symptoms are currently well-controlled with gabapentin.  Denies any numbness or tingling of the left hand.  She smokes about 6 cigarettes/day.  She has been trying to quit with the help of nicotine gums.  She has had 2 doses of COVID-vaccine.  She received flu vaccine in the office today.  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     Past Surgical History:  Procedure Laterality Date   arm surgery     CARPAL TUNNEL RELEASE     left elbow      Family History  Problem Relation Age of Onset   Stroke Other    Heart disease Other    High blood pressure Other     Social History   Socioeconomic History   Marital status: Single    Spouse name: Not on file   Number of  children: 2   Years of education: HS   Highest education level: Not on file  Occupational History    Employer: UNEMPLOYED    Comment: disabled  Tobacco Use   Smoking status: Every Day    Packs/day: 0.50    Types: Cigarettes   Smokeless tobacco: Never  Substance and Sexual Activity   Alcohol use: No    Alcohol/week: 0.0 standard drinks   Drug use: No   Sexual activity: Not on file  Other Topics Concern   Not on file  Social History Narrative   Patient is disabled. And she is divorced.    Caffeine one pot of coffee daily.   Left handed.   Social Determinants of Health   Financial Resource Strain: Not on file  Food Insecurity: Not on file  Transportation Needs: Not on file  Physical Activity: Not on file  Stress: Not on file  Social Connections: Not on file  Intimate Partner Violence: Not on file    ROS Review of Systems  Constitutional:  Positive for appetite change. Negative for chills and fever.  HENT:  Negative for congestion, sinus pressure, sinus pain and sore throat.   Eyes:  Negative for pain and discharge.  Respiratory:  Negative for  cough and shortness of breath.   Cardiovascular:  Negative for chest pain and palpitations.  Gastrointestinal:  Negative for abdominal pain, constipation, diarrhea, nausea and vomiting.  Endocrine: Negative for polydipsia and polyuria.  Genitourinary:  Negative for dysuria and hematuria.  Musculoskeletal:  Negative for neck pain and neck stiffness.  Skin:  Negative for rash.  Neurological:  Negative for dizziness and weakness.  Psychiatric/Behavioral:  Positive for dysphoric mood and sleep disturbance. Negative for agitation and behavioral problems. The patient is nervous/anxious.    Objective:   Today's Vitals: BP 132/82 (BP Location: Right Arm, Cuff Size: Normal)   Pulse 95   Temp 98.4 F (36.9 C) (Oral)   Resp 18   Ht 4\' 10"  (1.473 m)   Wt 95 lb 1.9 oz (43.1 kg)   LMP  (LMP Unknown)   SpO2 98%   BMI 19.88 kg/m    Physical Exam Vitals reviewed.  Constitutional:      General: She is not in acute distress.    Appearance: She is not diaphoretic.  HENT:     Head: Normocephalic and atraumatic.     Nose: Nose normal.     Mouth/Throat:     Mouth: Mucous membranes are moist.  Eyes:     General: No scleral icterus.    Extraocular Movements: Extraocular movements intact.  Cardiovascular:     Rate and Rhythm: Normal rate and regular rhythm.     Pulses: Normal pulses.     Heart sounds: Normal heart sounds. No murmur heard. Pulmonary:     Breath sounds: Normal breath sounds. No wheezing or rales.  Abdominal:     Palpations: Abdomen is soft.     Tenderness: There is no abdominal tenderness.  Musculoskeletal:     Cervical back: Neck supple. No tenderness.     Right lower leg: No edema.     Left lower leg: No edema.  Skin:    General: Skin is warm.     Findings: No rash.  Neurological:     General: No focal deficit present.     Mental Status: She is alert and oriented to person, place, and time.     Sensory: No sensory deficit.     Motor: No weakness.  Psychiatric:        Mood and Affect: Mood normal.        Behavior: Behavior normal.    Assessment & Plan:   Problem List Items Addressed This Visit       Encounter to establish care - Primary   Care established History and medications reviewed with the patient      Relevant Orders  CMP14+EGFR  CBC with Differential  TSH    Nervous and Auditory   Complex regional pain syndrome of left upper extremity    Had an injury of left elbow, needed multiple surgeries Has had cervical blocks in the past Well-controlled with Gabapentin 400 mg BID currently      Relevant Medications   mirtazapine (REMERON) 15 MG tablet   Other Relevant Orders   CMP14+EGFR   CBC with Differential   TSH     Other   Depression    Flowsheet Row Office Visit from 01/17/2021 in [facility]  PHQ-9 Total Score 16  Uncontrolled with  Lexapro Switched to Remeron to help with appetite and sleep as well      Relevant Medications   mirtazapine (REMERON) 15 MG tablet   Other Relevant Orders   TSH   Anxiety  GAD 7 : Generalized Anxiety Score 01/17/2021 02/02/2020  Nervous, Anxious, on Edge 3 3  Control/stop worrying 3 2  Worry too much - different things 3 2  Trouble relaxing 3 0  Restless 3 0  Easily annoyed or irritable 0 1  Afraid - awful might happen 3 2  Total GAD 7 Score 18 10  Anxiety Difficulty Very difficult Not difficult at all  Uncontrolled with Lexapro Switched to Remeron for depression and help with appetite and sleep      Relevant Medications   mirtazapine (REMERON) 15 MG tablet      Vitamin D deficiency    Takes Vitamin D supplements Check Vitamin D level      Relevant Orders   Vitamin D (25 hydroxy)   Tobacco abuse    Smokes about 6 cigarettes/day  Asked about quitting: confirms that she currently smokes cigarettes Advise to quit smoking: Educated about QUITTING to reduce the risk of cancer, cardio and cerebrovascular disease. Assess willingness: Unwilling to quit at this time, but is working on cutting back. Assist with counseling and pharmacotherapy: Counseled for 5 minutes and literature provided. Uses nicotine gums. Arrange for follow up: Follow up in 3 months and continue to offer help.      Other Visit Diagnoses     Need for immunization against influenza       Relevant Orders   Flu Vaccine QUAD 20mo+IM (Fluarix, Fluzone & Alfiuria Quad PF) (Completed)   Screening mammogram for breast cancer       Relevant Orders   MM 3D SCREEN BREAST BILATERAL   Routine cervical smear       Relevant Orders   Ambulatory referral to Obstetrics / Gynecology   Mixed hyperlipidemia       Relevant Orders   Lipid panel   Need for hepatitis C screening test       Relevant Orders   Hepatitis C Antibody   Encounter for screening for HIV       Relevant Orders   HIV antibody (with reflex)    Special screening for malignant neoplasms, colon       Relevant Orders   Ambulatory referral to Gastroenterology       Outpatient Encounter Medications as of 01/17/2021  Medication Sig   gabapentin (NEURONTIN) 400 MG capsule Take 1 capsule (400 mg total) by mouth 2 (two) times daily.   mirtazapine (REMERON) 15 MG tablet Take 1 tablet (15 mg total) by mouth at bedtime.   [DISCONTINUED] escitalopram (LEXAPRO) 20 MG tablet Take 1 tablet (20 mg total) by mouth every morning.   calcium-vitamin D (OSCAL WITH D) 500-200 MG-UNIT per tablet Take 1 tablet by mouth daily. (Patient not taking: Reported on 01/17/2021)   lidocaine (LIDODERM) 5 % Place 1 patch onto the skin daily. Remove & Discard patch within 12 hours or as directed by MD (Patient not taking: Reported on 01/17/2021)   PRESCRIPTION MEDICATION Apply 1 application topically daily as needed (for pain). PT GETS A COMPOUND PRESCRIPTION AT [facility]. KETAMINE 10% LIDOCAINE 5% AMITRIPALINE 2%  (Patient not taking: Reported on 01/17/2021)   [DISCONTINUED] UNABLE TO FIND TENS UNIT (Patient not taking: Reported on 01/17/2021)   [DISCONTINUED] Vitamin D, Ergocalciferol, (DRISDOL) 50000 UNITS CAPS Take 50,000 Units by mouth every Friday.  (Patient not taking: Reported on 01/17/2021)   No facility-administered encounter medications on file as of 01/17/2021.    Follow-up: Return in about 4 months (around 05/19/2021) for Depression and anxiety.   Lindell Spar, MD

## 2021-01-17 NOTE — Assessment & Plan Note (Signed)
Smokes about 6 cigarettes/day  Asked about quitting: confirms that she currently smokes cigarettes Advise to quit smoking: Educated about QUITTING to reduce the risk of cancer, cardio and cerebrovascular disease. Assess willingness: Unwilling to quit at this time, but is working on cutting back. Assist with counseling and pharmacotherapy: Counseled for 5 minutes and literature provided. Uses nicotine gums. Arrange for follow up: Follow up in 3 months and continue to offer help.

## 2021-01-17 NOTE — Assessment & Plan Note (Signed)
GAD 7 : Generalized Anxiety Score 01/17/2021 02/02/2020  Nervous, Anxious, on Edge 3 3  Control/stop worrying 3 2  Worry too much - different things 3 2  Trouble relaxing 3 0  Restless 3 0  Easily annoyed or irritable 0 1  Afraid - awful might happen 3 2  Total GAD 7 Score 18 10  Anxiety Difficulty Very difficult Not difficult at all   Uncontrolled with Lexapro Switched to Remeron for depression and help with appetite and sleep

## 2021-01-17 NOTE — Assessment & Plan Note (Signed)
Takes Vitamin D supplements Check Vitamin D level 

## 2021-01-17 NOTE — Progress Notes (Signed)
Subjective:   Jean Perez is a 65 y.o. female who presents for an Initial Medicare Annual Wellness Visit. I connected with  Jean Perez on 01/17/21 by a audio enabled telemedicine application and verified that I am speaking with the correct person using two identifiers.   I discussed the limitations of evaluation and management by telemedicine. The patient expressed understanding and agreed to proceed.   Location of patient:Home  Location of Provider: Office  Persons participating in virtual visit: Jean Perez (patient) and Manie Bealer Rouge  Review of Systems    Defer to PCP       Objective:    There were no vitals filed for this visit. There is no height or weight on file to calculate BMI.  Advanced Directives 01/17/2021 02/15/2016  Does Patient Have a Medical Advance Directive? No No  Would patient like information on creating a medical advance directive? - No - patient declined information    Current Medications (verified) Outpatient Encounter Medications as of 01/17/2021  Medication Sig   gabapentin (NEURONTIN) 400 MG capsule Take 1 capsule (400 mg total) by mouth 2 (two) times daily.   mirtazapine (REMERON) 15 MG tablet Take 1 tablet (15 mg total) by mouth at bedtime.   calcium-vitamin D (OSCAL WITH D) 500-200 MG-UNIT per tablet Take 1 tablet by mouth daily. (Patient not taking: No sig reported)   lidocaine (LIDODERM) 5 % Place 1 patch onto the skin daily. Remove & Discard patch within 12 hours or as directed by MD (Patient not taking: No sig reported)   PRESCRIPTION MEDICATION Apply 1 application topically daily as needed (for pain). PT GETS A COMPOUND PRESCRIPTION AT [facility]. KETAMINE 10% LIDOCAINE 5% AMITRIPALINE 2%  (Patient not taking: No sig reported)   No facility-administered encounter medications on file as of 01/17/2021.    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    Past Surgical History:  Procedure Laterality Date   arm surgery     CARPAL TUNNEL RELEASE     left elbow     Family History  Problem Relation Age of Onset   Stroke Other    Heart disease Other    High blood pressure Other    Social History   Socioeconomic History   Marital status: Single    Spouse name: Not on file   Number of children: 2   Years of education: HS   Highest education level: Not on file  Occupational History    Employer: UNEMPLOYED    Comment: disabled  Tobacco Use   Smoking status: Every Day    Packs/day: 0.50    Types: Cigarettes   Smokeless tobacco: Never  Substance and Sexual Activity   Alcohol use: No    Alcohol/week: 0.0 standard drinks   Drug use: No   Sexual activity: Not on file  Other Topics Concern   Not on file  Social History Narrative   Patient is disabled. And she is divorced.    Caffeine one pot of coffee daily.   Left handed.   Social Determinants of Health   Financial Resource Strain: Low Risk    Difficulty of Paying Living Expenses: Not hard at all  Food Insecurity: No Food Insecurity   Worried About Programme researcher, broadcasting/film/video in the Last  Year: Never true   Ran Out of Food in the Last Year: Never true  Transportation Needs: No Transportation Needs   Lack of Transportation (Medical): No   Lack of Transportation (Non-Medical): No  Physical Activity: Sufficiently Active   Days of Exercise per Week: 5 days   Minutes of Exercise per Session: 30 min  Stress: Stress Concern Present   Feeling of Stress : Rather much  Social Connections: Socially Isolated   Frequency of Communication with Friends and Family: Once a week   Frequency of Social Gatherings with Friends and Family: Never   Attends Religious Services: Never   Diplomatic Services operational officer: No   Attends Engineer, structural: Never   Marital Status:  Living with partner    Tobacco Counseling Ready to quit: Not Answered Counseling given: Not Answered   Clinical Intake:  Pre-visit preparation completed: Yes  Pain : No/denies pain     Nutritional Risks: None Diabetes: No  How often do you need to have someone help you when you read instructions, pamphlets, or other written materials from your doctor or pharmacy?: 1 - Never What is the last grade level you completed in school?: 12TH GRADE  Diabetic?No  Interpreter Needed?: No  Information entered by :: Retha Bither J,CMA   Activities of Daily Living In your present state of health, do you have any difficulty performing the following activities: 01/17/2021  Hearing? N  Vision? N  Difficulty concentrating or making decisions? Y  Comment concentrating can be hard at times  Walking or climbing stairs? N  Dressing or bathing? N  Doing errands, shopping? N  Preparing Food and eating ? N  Using the Toilet? N  In the past six months, have you accidently leaked urine? N  Managing your Medications? N  Managing your Finances? N  Housekeeping or managing your Housekeeping? N  Some recent data might be hidden    Patient Care Team: Anabel Halon, MD as PCP - General (Internal Medicine)  Indicate any recent Medical Services you may have received from other than [facility] providers in the past year (date may be approximate).     Assessment:   This is a routine wellness examination for Jean Perez.  Hearing/Vision screen No results found.  Dietary issues and exercise activities discussed: Current Exercise Habits: Home exercise routine, Type of exercise: walking, Time (Minutes): 30, Frequency (Times/Week): 5, Weekly Exercise (Minutes/Week): 150, Intensity: Mild   Goals Addressed   None    Depression Screen PHQ 2/9 Scores 01/17/2021 11/13/2020 02/02/2020 06/21/2019  PHQ - 2 Score 3 0 3 4  PHQ- 9 Score 16 2 10 12     Fall Risk Fall Risk  01/17/2021  Falls in the past year? 0  Number falls  in past yr: 0  Injury with Fall? 0  Risk for fall due to : No Fall Risks  Follow up Falls evaluation completed    FALL RISK PREVENTION PERTAINING TO THE HOME:  Any stairs in or around the home? Yes  If so, are there any without handrails? No  Home free of loose throw rugs in walkways, pet beds, electrical cords, etc? Yes  Adequate lighting in your home to reduce risk of falls? Yes   ASSISTIVE DEVICES UTILIZED TO PREVENT FALLS:  Life alert? No  Use of a cane, walker or w/c? No  Grab bars in the bathroom? No  Shower chair or bench in shower? No  Elevated toilet seat or a handicapped toilet? No   TIMED  UP AND GO:  Was the test performed?  N/A .  Length of time to ambulate 10 feet: N/A sec.     Cognitive Function:     6CIT Screen 01/17/2021  What Year? 0 points  What month? 0 points  What time? 0 points  Count back from 20 0 points  Months in reverse 0 points  Repeat phrase 0 points  Total Score 0    Immunizations Immunization History  Administered Date(s) Administered   Influenza Inj Mdck Quad Pf 02/25/2018   Influenza,inj,Quad PF,6+ Mos 01/17/2021   Influenza-Unspecified 02/23/2013, 02/25/2018, 03/01/2019   Moderna Sars-Covid-2 Vaccination 08/25/2019, 09/22/2019    TDAP status: Due, Education has been provided regarding the importance of this vaccine. Advised may receive this vaccine at local pharmacy or Health Dept. Aware to provide a copy of the vaccination record if obtained from local pharmacy or Health Dept. Verbalized acceptance and understanding.  Flu Vaccine status: Up to date  Pneumococcal vaccine status: Due, Education has been provided regarding the importance of this vaccine. Advised may receive this vaccine at local pharmacy or Health Dept. Aware to provide a copy of the vaccination record if obtained from local pharmacy or Health Dept. Verbalized acceptance and understanding.  Covid-19 vaccine status: Information provided on how to obtain vaccines.    Qualifies for Shingles Vaccine? Yes   Zostavax completed No   Shingrix Completed?: No.    Education has been provided regarding the importance of this vaccine. Patient has been advised to call insurance company to determine out of pocket expense if they have not yet received this vaccine. Advised may also receive vaccine at local pharmacy or Health Dept. Verbalized acceptance and understanding.  Screening Tests Health Maintenance  Topic Date Due   Pneumococcal Vaccine 670-63 Years old (1 - PCV) Never done   Hepatitis C Screening  Never done   TETANUS/TDAP  Never done   PAP SMEAR-Modifier  Never done   COLONOSCOPY (Pts [id number] Insurance coverage will need to be confirmed)  Never done   Zoster Vaccines- Shingrix (1 of 2) Never done   MAMMOGRAM  04/20/2013   COVID-19 Vaccine (3 - Booster for Moderna series) 02/22/2020   INFLUENZA VACCINE  Completed   HIV Screening  Completed   HPV VACCINES  Aged Out    Health Maintenance  Health Maintenance Due  Topic Date Due   Pneumococcal Vaccine 330-63 Years old (1 - PCV) Never done   Hepatitis C Screening  Never done   TETANUS/TDAP  Never done   PAP SMEAR-Modifier  Never done   COLONOSCOPY (Pts [id number] Insurance coverage will need to be confirmed)  Never done   Zoster Vaccines- Shingrix (1 of 2) Never done   MAMMOGRAM  04/20/2013   COVID-19 Vaccine (3 - Booster for Moderna series) 02/22/2020    Colorectal cancer screening: Referral to GI placed 01/17/2021. Pt aware the office will call re: appt.  Mammogram status: Ordered  . Pt provided with contact info and advised to call to schedule appt.  Scheduled for 02/11/2021.    Lung Cancer Screening: (Low Dose CT Chest recommended if Age 60-80 years, 30 pack-year currently smoking OR have quit w/in 15years.) does qualify.   Lung Cancer Screening Referral: NO  Additional Screening:  Hepatitis C Screening: does qualify; Not Completed   Vision Screening: Recommended annual ophthalmology exams  for early detection of glaucoma and other disorders of the eye. Is the patient up to date with their annual eye exam?  Yes  scheduled for 03/25/2021  Who is the provider or what is the name of the office in which the patient attends annual eye exams? [facility] If pt is not established with a provider, would they like to be referred to a provider to establish care? No .   Dental Screening: Recommended annual dental exams for proper oral hygiene  Community Resource Referral / Chronic Care Management: CRR required this visit?  No   CCM required this visit?  No      Plan:     I have personally reviewed and noted the following in the patient's chart:   Medical and social history Use of alcohol, tobacco or illicit drugs  Current medications and supplements including opioid prescriptions. Patient is not currently taking opioid prescriptions. Functional ability and status Nutritional status Physical activity Advanced directives List of other physicians Hospitalizations, surgeries, and ER visits in previous 12 months Vitals Screenings to include cognitive, depression, and falls Referrals and appointments  In addition, I have reviewed and discussed with patient certain preventive protocols, quality metrics, and best practice recommendations. A written personalized care plan for preventive services as well as general preventive health recommendations were provided to patient.     Victorio Palm, [facility]   01/17/2021   Nurse Notes: Non Face to Face 30 minute visit.  Jean Perez , Thank you for taking time to come for your Medicare Wellness Visit. I appreciate your ongoing commitment to your health goals. Please review the following plan we discussed and let me know if I can assist you in the future.   These are the goals we discussed:  Goals   None     This is a list of the screening recommended for you and due dates:  Health Maintenance  Topic Date Due   Pneumococcal Vaccination  (1 - PCV) Never done   Hepatitis C Screening: USPSTF Recommendation to screen - Ages 25-79 yo.  Never done   Tetanus Vaccine  Never done   Pap Smear  Never done   Colon Cancer Screening  Never done   Zoster (Shingles) Vaccine (1 of 2) Never done   Mammogram  04/20/2013   COVID-19 Vaccine (3 - Booster for Moderna series) 02/22/2020   Flu Shot  Completed   HIV Screening  Completed   HPV Vaccine  Aged Out    Patient advised that office will mail AVS.

## 2021-01-17 NOTE — Assessment & Plan Note (Signed)
Flowsheet Row Office Visit from 01/17/2021 in [facility]  PHQ-9 Total Score 16     Uncontrolled with Lexapro Switched to Remeron to help with appetite and sleep as well

## 2021-01-27 DIAGNOSIS — N390 Urinary tract infection, site not specified: Secondary | ICD-10-CM | POA: Diagnosis not present

## 2021-02-11 ENCOUNTER — Other Ambulatory Visit: Payer: Self-pay

## 2021-02-11 ENCOUNTER — Ambulatory Visit (INDEPENDENT_AMBULATORY_CARE_PROVIDER_SITE_OTHER): Payer: Medicare Other | Admitting: Nurse Practitioner

## 2021-02-11 ENCOUNTER — Ambulatory Visit (HOSPITAL_COMMUNITY)
Admission: RE | Admit: 2021-02-11 | Discharge: 2021-02-11 | Disposition: A | Discharge status: Home / Self Care | Payer: Medicare Other | Source: Ambulatory Visit | Attending: Internal Medicine | Admitting: Internal Medicine

## 2021-02-11 ENCOUNTER — Encounter (HOSPITAL_COMMUNITY): Payer: Self-pay

## 2021-02-11 ENCOUNTER — Encounter: Payer: Self-pay | Admitting: Nurse Practitioner

## 2021-02-11 VITALS — BP 131/91 | HR 78 | Temp 98.7°F | Ht <= 58 in | Wt 97.0 lb

## 2021-02-11 DIAGNOSIS — R300 Dysuria: Secondary | ICD-10-CM | POA: Diagnosis not present

## 2021-02-11 DIAGNOSIS — R350 Frequency of micturition: Secondary | ICD-10-CM | POA: Insufficient documentation

## 2021-02-11 DIAGNOSIS — Z1231 Encounter for screening mammogram for malignant neoplasm of breast: Secondary | ICD-10-CM | POA: Insufficient documentation

## 2021-02-11 LAB — POCT URINALYSIS DIP (CLINITEK)
Bilirubin, UA: NEGATIVE
Blood, UA: NEGATIVE
Glucose, UA: NEGATIVE mg/dL
Ketones, POC UA: NEGATIVE mg/dL
Leukocytes, UA: NEGATIVE
Nitrite, UA: NEGATIVE
POC PROTEIN,UA: NEGATIVE
Spec Grav, UA: 1.01 (ref 1.010–1.025)
Urobilinogen, UA: 0.2 E.U./dL
pH, UA: 5.5 (ref 5.0–8.0)

## 2021-02-11 MED ORDER — NITROFURANTOIN MONOHYD MACRO 100 MG PO CAPS: 100.0000 mg | ORAL_CAPSULE | Freq: Two times a day (BID) | ORAL | 0 refills | Status: DC

## 2021-02-11 MED ORDER — PHENAZOPYRIDINE HCL 97.2 MG PO TABS: 97.0000 mg | ORAL_TABLET | Freq: Three times a day (TID) | ORAL | 0 refills | Status: DC | PRN

## 2021-02-11 MED ORDER — OXYBUTYNIN CHLORIDE ER 15 MG PO TB24: 15.0000 mg | ORAL_TABLET | Freq: Every day | ORAL | 1 refills | Status: DC

## 2021-02-11 NOTE — Assessment & Plan Note (Signed)
-  recently treated (01/27/21) for UTI by urgent care in [place] -she is having some light pressure today that feels like her previous UTI -no flank pain or fevers, so will treat for uncomplicated UTI -U/A and culture today; U/A was pan-negative -Rx. macrobid and Azo

## 2021-02-11 NOTE — Progress Notes (Signed)
Acute Office Visit  Subjective:    Patient ID: Jean Perez, female    DOB: 1956-03-21, 65 y.o.   MRN: [id number]  Chief Complaint  Patient presents with   Urinary Tract Infection    Potential UTI. Frequent and burning during urination. Tried AZO and cranberry gel caps, seemed to help a little bit but not completely. Went to urgent care in [place] and gave her a 7 day antibiotic.     Urinary Tract Infection  Associated symptoms include urgency.  Patient is in today for dysuria and urinary frequency.  She tried AZO and cranberry gel caps, but those didn't completely relieve symptoms. She last took Azo in the middle of September.  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     Past Surgical History:  Procedure Laterality Date   arm surgery     CARPAL TUNNEL RELEASE     left elbow      Family History  Problem Relation Age of Onset   Breast cancer Maternal Aunt    Stroke Other    Heart disease Other    High blood pressure Other     Social History   Socioeconomic History   Marital status: Single    Spouse name: Not on file   Number of children: 2   Years of education: HS   Highest education level: Not on file  Occupational History    Employer: UNEMPLOYED    Comment: disabled  Tobacco Use   Smoking status: Every Day    Packs/day: 0.50    Types: Cigarettes   Smokeless tobacco: Never  Substance and Sexual Activity   Alcohol use: No    Alcohol/week: 0.0 standard drinks   Drug use: No   Sexual activity: Not on file  Other Topics Concern   Not on file  Social History Narrative   Patient is disabled. And she is divorced.    Caffeine one pot of coffee daily.   Left handed.   Social  Determinants of Health   Financial Resource Strain: Low Risk    Difficulty of Paying Living Expenses: Not hard at all  Food Insecurity: No Food Insecurity   Worried About Programme researcher, broadcasting/film/video in the Last Year: Never true   Ran Out of Food in the Last Year: Never true  Transportation Needs: No Transportation Needs   Lack of Transportation (Medical): No   Lack of Transportation (Non-Medical): No  Physical Activity: Sufficiently Active   Days of Exercise per Week: 5 days   Minutes of Exercise per Session: 30 min  Stress: Stress Concern Present   Feeling of Stress : Rather much  Social Connections: Socially Isolated   Frequency of Communication with Friends and Family: Once a week   Frequency of Social Gatherings with Friends and Family: Never   Attends Religious Services: Never   Database administrator or Organizations: No   Attends Engineer, structural: Never   Marital Status: Living with partner  Intimate Partner Violence: Not At Risk   Fear of Current or Ex-Partner: No   Emotionally Abused: No   Physically Abused: No   Sexually Abused: No  Outpatient Medications Prior to Visit  Medication Sig Dispense Refill   calcium-vitamin D (OSCAL WITH D) 500-200 MG-UNIT per tablet Take 1 tablet by mouth daily.     gabapentin (NEURONTIN) 400 MG capsule Take 1 capsule (400 mg total) by mouth 2 (two) times daily. 60 capsule 5   lidocaine (LIDODERM) 5 % Place 1 patch onto the skin daily. Remove & Discard patch within 12 hours or as directed by MD 30 patch 11   mirtazapine (REMERON) 15 MG tablet Take 1 tablet (15 mg total) by mouth at bedtime. 30 tablet 3   PRESCRIPTION MEDICATION Apply 1 application topically daily as needed (for pain). PT GETS A COMPOUND PRESCRIPTION AT [facility]USTOM [facility]. KETAMINE 10% LIDOCAINE 5% AMITRIPALINE 2%     No facility-administered medications prior to visit.    Allergies  Allergen Reactions   Penicillins Hives, Rash and Other (See Comments)    Has patient  had a PCN reaction causing immediate rash, facial/tongue/throat swelling, SOB or lightheadedness with hypotension: Yes Has patient had a PCN reaction causing severe rash involving mucus membranes or skin necrosis: Yes Has patient had a PCN reaction that required hospitalization No Has patient had a PCN reaction occurring within the last 10 years: No If all of the above answers are "NO", then may proceed with Cephalosporin use.   Other Reaction: BRONCHOSPASM    Review of Systems  Constitutional: Negative.   Respiratory: Negative.    Cardiovascular: Negative.   Genitourinary:  Positive for dysuria and urgency.      Objective:    Physical Exam Constitutional:      Appearance: Normal appearance.  Abdominal:     General: Abdomen is flat.     Palpations: Abdomen is soft.     Tenderness: There is no abdominal tenderness. There is no right CVA tenderness or left CVA tenderness.  Neurological:     Mental Status: She is alert.    BP (!) 131/91 (BP Location: Left Arm, Patient Position: Sitting)   Pulse 78   Temp 98.7 F (37.1 C) (Oral)   Ht 4\' 10"  (1.473 m)   Wt 97 lb (44 kg)   LMP  (LMP Unknown)   SpO2 97%   BMI 20.27 kg/m  Wt Readings from Last 3 Encounters:  02/11/21 97 lb (44 kg)  01/17/21 95 lb 1.9 oz (43.1 kg)  11/13/20 99 lb (44.9 kg)    Health Maintenance Due  Topic Date Due   Hepatitis C Screening  Never done   TETANUS/TDAP  Never done   PAP SMEAR-Modifier  Never done   COLONOSCOPY (Pts 45-56yrs Insurance coverage will need to be confirmed)  Never done   Zoster Vaccines- Shingrix (1 of 2) Never done   MAMMOGRAM  04/20/2013   COVID-19 Vaccine (3 - Booster for Moderna series) 02/22/2020    There are no preventive care reminders to display for this patient.   No results found for: TSH Lab Results  Component Value Date   WBC 12.2 (H) 10/29/2016   HGB 13.7 10/29/2016   HCT 41.6 10/29/2016   MCV 94 10/29/2016   PLT 366 10/29/2016   Lab Results  Component  Value Date   NA 134 (L) 02/15/2016   K 3.8 05/21/2018   CO2 26 02/15/2016   GLUCOSE 107 (H) 02/15/2016   BUN 13 02/15/2016   CREATININE 0.74 02/15/2016   BILITOT 0.3 02/15/2016   ALKPHOS 67 02/15/2016   AST 17 02/15/2016   ALT 21 02/15/2016   PROT 8.0 02/15/2016  ALBUMIN 4.2 02/15/2016   CALCIUM 9.5 02/15/2016   ANIONGAP 7 02/15/2016   No results found for: CHOL No results found for: HDL No results found for: LDLCALC No results found for: TRIG No results found for: CHOLHDL No results found for: DGLO7F     Assessment & Plan:   Problem List Items Addressed This Visit       Other   Dysuria - Primary    -recently treated (01/27/21) for UTI by urgent care in [place] -she is having some light pressure today that feels like her previous UTI -no flank pain or fevers, so will treat for uncomplicated UTI -U/A and culture today; U/A was pan-negative -Rx. macrobid and Azo      Relevant Medications   nitrofurantoin, macrocrystal-monohydrate, (MACROBID) 100 MG capsule   phenazopyridine (PYRIDIUM) 97 MG tablet   Other Relevant Orders   Urine Culture   POCT URINALYSIS DIP (CLINITEK) (Completed)   Urinary frequency    -UTI vs OAB -will treat for both -Rx. Myrbetriq, macrobid, and Azo        Meds ordered this encounter  Medications   nitrofurantoin, macrocrystal-monohydrate, (MACROBID) 100 MG capsule    Sig: Take 1 capsule (100 mg total) by mouth 2 (two) times daily.    Dispense:  10 capsule    Refill:  0   phenazopyridine (PYRIDIUM) 97 MG tablet    Sig: Take 1 tablet (97 mg total) by mouth 3 (three) times daily as needed for pain.    Dispense:  10 tablet    Refill:  0   oxybutynin (DITROPAN XL) 15 MG 24 hr tablet    Sig: Take 1 tablet (15 mg total) by mouth daily.    Dispense:  90 tablet    Refill:  1     Heather Roberts, NP

## 2021-02-11 NOTE — Assessment & Plan Note (Signed)
-  UTI vs OAB -will treat for both -Rx. Myrbetriq, macrobid, and Azo

## 2021-02-12 ENCOUNTER — Other Ambulatory Visit: Payer: Self-pay

## 2021-02-12 DIAGNOSIS — R300 Dysuria: Secondary | ICD-10-CM

## 2021-02-12 MED ORDER — PHENAZOPYRIDINE HCL 97.2 MG PO TABS: 97.0000 mg | ORAL_TABLET | Freq: Three times a day (TID) | ORAL | 0 refills | Status: DC | PRN

## 2021-02-13 ENCOUNTER — Telehealth: Payer: Self-pay

## 2021-02-13 ENCOUNTER — Other Ambulatory Visit: Payer: Self-pay | Admitting: Nurse Practitioner

## 2021-02-13 DIAGNOSIS — R350 Frequency of micturition: Secondary | ICD-10-CM

## 2021-02-13 NOTE — Telephone Encounter (Signed)
Patient called said she still having pressure, was seen 10/3 and what is her next steps. Call back # [phone].

## 2021-02-13 NOTE — Telephone Encounter (Signed)
Patient aware.

## 2021-02-13 NOTE — Telephone Encounter (Signed)
I sent a referral to urology so she can have a post-void residual completed. They can make sure her bladder is getting emptied after she urinates.

## 2021-02-13 NOTE — Progress Notes (Signed)
-  prescribed ditropan at last OV d/t cost of myretriq -pt is still having urinary pressure -referral to urology

## 2021-02-15 LAB — URINE CULTURE

## 2021-02-15 NOTE — Progress Notes (Signed)
Urine culture was negative. Did medication for overactive bladder help?

## 2021-02-19 ENCOUNTER — Ambulatory Visit (INDEPENDENT_AMBULATORY_CARE_PROVIDER_SITE_OTHER): Payer: Medicare Other | Admitting: Urology

## 2021-02-19 ENCOUNTER — Other Ambulatory Visit: Payer: Self-pay

## 2021-02-19 ENCOUNTER — Encounter: Payer: Self-pay | Admitting: Urology

## 2021-02-19 VITALS — BP 102/68 | HR 109

## 2021-02-19 DIAGNOSIS — R350 Frequency of micturition: Secondary | ICD-10-CM

## 2021-02-19 LAB — MICROSCOPIC EXAMINATION
Bacteria, UA: NONE SEEN
Renal Epithel, UA: NONE SEEN /hpf

## 2021-02-19 LAB — URINALYSIS, ROUTINE W REFLEX MICROSCOPIC
Bilirubin, UA: NEGATIVE
Glucose, UA: NEGATIVE
Ketones, UA: NEGATIVE
Leukocytes,UA: NEGATIVE
Nitrite, UA: NEGATIVE
Protein,UA: NEGATIVE
Specific Gravity, UA: <1.005 — ABNORMAL LOW (ref 1.005–1.030)
Urobilinogen, Ur: 0.2 mg/dL (ref 0.2–1.0)
pH, UA: 6 (ref 5.0–7.5)

## 2021-02-19 LAB — BLADDER SCAN AMB NON-IMAGING: Scan Result: 44

## 2021-02-19 NOTE — Progress Notes (Signed)
Urological Symptom Review PVR 44  Patient is experiencing the following symptoms: Frequent urination Get up at night to urinate   Review of Systems  Gastrointestinal (upper)  : Negative for upper GI symptoms  Gastrointestinal (lower) : Negative for lower GI symptoms  Constitutional : Weight loss Fatigue  Skin: Negative for skin symptoms  Eyes: Negative for eye symptoms  Ear/Nose/Throat : Negative for Ear/Nose/Throat symptoms  Hematologic/Lymphatic: Negative for Hematologic/Lymphatic symptoms  Cardiovascular : Negative for cardiovascular symptoms  Respiratory : Negative for respiratory symptoms  Endocrine: Negative for endocrine symptoms  Musculoskeletal: Negative for musculoskeletal symptoms  Neurological: Headaches  Psychologic: Depression Anxiety

## 2021-02-19 NOTE — Progress Notes (Signed)
Assessment: 1. Urine frequency      Plan: No evidence of a UTI on today's visit. I discussed the possibility that her symptoms may be associated with overactive bladder.  She is currently feeling much better.  We discussed monitoring her symptoms to see if there is further improvement before trying any medication for her bladder symptoms.  She is in agreement with this plan.  She does have a prescription for oxybutynin that she could try if her symptoms do not improve. Return to office in 1 month.  Chief Complaint:  Chief Complaint  Patient presents with   Urinary Frequency    History of Present Illness:  Jean Perez is a 65 y.o. year old female who is seen in consultation from Bjorn Pippin, NP for evaluation of urinary frequency and urgency.  She noted onset of frequency and urgency on 01/24/2021.  This worsened over the next several days.  She was evaluated by urgent care in [place].  She was diagnosed with a UTI and treated with Macrobid x1 week.  No urine culture results available.  Her symptoms improved with the antibiotics but subsequently returned approximately 10 days later.  She was again treated for a possible UTI with Macrobid.  Urine culture from 02/15/2021 grew <10K mixed flora.  Her symptoms did improve after completing the course of antibiotics.  She was having some low back pain, suprapubic pressure, and dysuria.  She is no longer having any dysuria and the suprapubic pressure has decreased.  She continues to have some frequency and nocturia 1-2 times.  No gross hematuria or flank pain.  She was given a prescription for Myrbetriq but did not fill this due to cost.  She was also given a prescription for oxybutynin XR 15 mg but has not taken this medication.  She has previously undergone urethral dilation by Dr. Marcelyn Bruins in 2002 and 2008.  No history of frequent UTIs.   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     Past Surgical History:  Past Surgical History:  Procedure Laterality Date   arm surgery     CARPAL TUNNEL RELEASE     left elbow      Allergies:  Allergies  Allergen Reactions   Penicillins Hives, Rash and Other (See Comments)    Has patient had a PCN reaction causing immediate rash, facial/tongue/throat swelling, SOB or lightheadedness with hypotension: Yes Has patient had a PCN reaction causing severe rash involving mucus membranes or skin necrosis: Yes Has patient had a PCN reaction that required hospitalization No Has patient had a PCN reaction occurring within the last 10 years: No If all of the above answers are "NO", then may proceed with Cephalosporin use.   Other Reaction: BRONCHOSPASM    Family History:  Family History  Problem Relation Age of Onset   Breast cancer Maternal Aunt    Stroke Other    Heart disease Other    High blood pressure Other     Social History:  Social History   Tobacco Use   Smoking status: Every Day    Packs/day: 0.50    Types: Cigarettes   Smokeless tobacco: Never  Substance Use Topics   Alcohol use: No    Alcohol/week: 0.0 standard drinks   Drug use: No  Review of symptoms:  Constitutional:  Negative for unexplained weight loss, night sweats, fever, chills ENT:  Negative for nose bleeds, sinus pain, painful swallowing CV:  Negative for chest pain, shortness of breath, exercise intolerance, palpitations, loss of consciousness Resp:  Negative for cough, wheezing, shortness of breath GI:  Negative for nausea, vomiting, diarrhea, bloody stools GU:  Positives noted in HPI; otherwise negative for gross hematuria, urinary incontinence Neuro:  Negative for seizures, poor balance, limb weakness, slurred speech Psych:  Negative for lack of energy, depression, anxiety Endocrine:  Negative for polydipsia, polyuria, symptoms of  hypoglycemia (dizziness, hunger, sweating) Hematologic:  Negative for anemia, purpura, petechia, prolonged or excessive bleeding, use of anticoagulants  Allergic:  Negative for difficulty breathing or choking as a result of exposure to anything; no shellfish allergy; no allergic response (rash/itch) to materials, foods  Physical exam: BP 102/68   Pulse (!) 109   LMP  (LMP Unknown)  GENERAL APPEARANCE:  Well appearing, well developed, well nourished, NAD HEENT: Atraumatic, Normocephalic, oropharynx clear. NECK: Supple without lymphadenopathy or thyromegaly. LUNGS: Clear to auscultation bilaterally. HEART: Regular Rate and Rhythm without murmurs, gallops, or rubs. ABDOMEN: Soft, non-tender, No Masses. EXTREMITIES: Moves all extremities well.  Without clubbing, cyanosis, or edema. NEUROLOGIC:  Alert and oriented x 3, normal gait, CN II-XII grossly intact.  MENTAL STATUS:  Appropriate. BACK:  Non-tender to palpation.  No CVAT SKIN:  Warm, dry and intact.    Results: Results for orders placed or performed in visit on 02/19/21 (from the past 24 hour(s))  Microscopic Examination   Collection Time: 02/19/21  3:11 PM   Urine  Result Value Ref Range   WBC, UA 0-5 0 - 5 /hpf   RBC 0-2 0 - 2 /hpf   Epithelial Cells (non renal) 0-10 0 - 10 /hpf   Renal Epithel, UA None seen None seen /hpf   Bacteria, UA None seen None seen/Few  Urinalysis, Routine w reflex microscopic   Collection Time: 02/19/21  3:11 PM  Result Value Ref Range   Specific Gravity, UA <1.005 (L) 1.005 - 1.030   pH, UA 6.0 5.0 - 7.5   Color, UA Yellow Yellow   Appearance Ur Clear Clear   Leukocytes,UA Negative Negative   Protein,UA Negative Negative/Trace   Glucose, UA Negative Negative   Ketones, UA Negative Negative   RBC, UA Trace (A) Negative   Bilirubin, UA Negative Negative   Urobilinogen, Ur 0.2 0.2 - 1.0 mg/dL   Nitrite, UA Negative Negative   Microscopic Examination See below:   BLADDER SCAN AMB NON-IMAGING    Collection Time: 02/19/21  3:18 PM  Result Value Ref Range   Scan Result 44

## 2021-03-05 ENCOUNTER — Encounter: Payer: Self-pay | Admitting: Obstetrics & Gynecology

## 2021-03-05 ENCOUNTER — Ambulatory Visit (INDEPENDENT_AMBULATORY_CARE_PROVIDER_SITE_OTHER): Payer: Medicare Other | Admitting: Obstetrics & Gynecology

## 2021-03-05 ENCOUNTER — Other Ambulatory Visit: Payer: Self-pay

## 2021-03-05 ENCOUNTER — Other Ambulatory Visit (HOSPITAL_COMMUNITY)
Admission: RE | Admit: 2021-03-05 | Discharge: 2021-03-05 | Disposition: A | Discharge status: Home / Self Care | Payer: Medicare Other | Source: Ambulatory Visit | Attending: Obstetrics & Gynecology | Admitting: Obstetrics & Gynecology

## 2021-03-05 VITALS — BP 136/92 | HR 106 | Ht <= 58 in | Wt 97.0 lb

## 2021-03-05 DIAGNOSIS — Z124 Encounter for screening for malignant neoplasm of cervix: Secondary | ICD-10-CM | POA: Diagnosis not present

## 2021-03-05 DIAGNOSIS — Z1151 Encounter for screening for human papillomavirus (HPV): Secondary | ICD-10-CM | POA: Diagnosis not present

## 2021-03-05 DIAGNOSIS — Z01419 Encounter for gynecological examination (general) (routine) without abnormal findings: Secondary | ICD-10-CM | POA: Insufficient documentation

## 2021-03-05 NOTE — Progress Notes (Signed)
   GYN EXAMINATION Patient name: Jean Perez MRN [id number]  Date of birth: 03-Jul-1955 Chief Complaint:   Gynecologic Exam  History of Present Illness:   Jean Perez is a 65 y.o. PM female being seen today for a routine well-woman exam.  Today she notes: no acute complaints.  Being seen by urology regarding OAB- upon further questioning, may have interstitial cystitis.  Notes she was drinking a pot of coffee per day- no longer drinking any coffee.  While not 100% gone she has noted some resolution of her symptoms- urinary frequency mostly.  Last pap many years ago.  Last mammogram: 02/2021. Last colonoscopy: not completed  Depression screen [facility] 2/9 03/05/2021 02/11/2021 01/17/2021 11/13/2020 02/02/2020  Decreased Interest 1 0 0 0 0  Down, Depressed, Hopeless 1 0 3 0 3  PHQ - 2 Score 2 0 3 0 3  Altered sleeping 2 - 2 0 1  Tired, decreased energy 2 - 3 1 1   Change in appetite 2 - 3 1 0  Feeling bad or failure about yourself  0 - 1 0 1  Trouble concentrating 1 - 1 0 3  Moving slowly or fidgety/restless 0 - 3 0 1  Suicidal thoughts 0 - 0 0 0  PHQ-9 Score 9 - 16 2 10   Difficult doing work/chores - - Somewhat difficult - Not difficult at all     Review of Systems:   Pertinent items are noted in HPI Denies any headaches, blurred vision, fatigue, shortness of breath, chest pain, abdominal pain, bowel movements or intercourse unless otherwise stated above.  Pertinent History Reviewed:  Reviewed past medical,surgical, social and family history.  Reviewed problem list, medications and allergies. Physical Assessment:   Vitals:   03/05/21 1344  BP: (!) 136/92  Pulse: (!) 106  Weight: 97 lb (44 kg)  Height: 4\' 10"  (1.473 m)  Body mass index is 20.27 kg/m.        Physical Examination:   General appearance - well appearing, and in no distress  Mental status - alert, oriented to person, place, and time  Psych:  She has a normal mood and affect  Skin - warm and dry, normal color, no  suspicious lesions noted  Chest - effort normal, all lung fields clear to auscultation bilaterally  Heart - normal rate and regular rhythm  Neck:  midline trachea, no thyromegaly or nodules  Abdomen - soft, nontender, nondistended, no masses or organomegaly  Pelvic - VULVA: normal appearing vulva with no masses, tenderness or lesions  VAGINA: normal appearing vagina with normal color and discharge, no lesions  CERVIX: normal appearing cervix without discharge or lesions, no CMT  Thin prep pap is done with HR HPV cotesting  UTERUS: uterus is felt to be normal size, shape, consistency and nontender   ADNEXA: No adnexal masses or tenderness noted.  Extremities:  No swelling or varicosities noted  Chaperone: & Plan:  1) Well-Woman Exam -pap collected, reviewed guidelines -mammogram up to date -in the process for scheduling colonoscopyv   Meds: No orders of the defined types were placed in this encounter.   Follow-up: Return in about 1 year (around 03/05/2022) for Annual.   , DO Attending Obstetrician & Gynecologist, [facility], [facility]

## 2021-03-06 ENCOUNTER — Encounter: Payer: Medicare Other | Admitting: Obstetrics & Gynecology

## 2021-03-08 ENCOUNTER — Telehealth: Payer: Self-pay

## 2021-03-08 LAB — CYTOLOGY - PAP
Comment: NEGATIVE
Diagnosis: NEGATIVE
High risk HPV: NEGATIVE

## 2021-03-08 NOTE — Telephone Encounter (Signed)
Called pt per Dr Charlotta Newton to relay test results. Two identifiers used. Pt confirmed understanding.

## 2021-03-08 NOTE — Telephone Encounter (Signed)
-----   Message from Myna Hidalgo, DO sent at 03/08/2021  8:16 AM EDT ----- Pap/HPV negative ----- Message ----- From: Interface, [facility] Sent: 03/08/2021   7:18 AM EDT To: Myna Hidalgo, DO

## 2021-03-25 DIAGNOSIS — H40053 Ocular hypertension, bilateral: Secondary | ICD-10-CM | POA: Diagnosis not present

## 2021-03-28 ENCOUNTER — Ambulatory Visit: Payer: Medicare Other | Admitting: Urology

## 2021-05-16 DIAGNOSIS — E782 Mixed hyperlipidemia: Secondary | ICD-10-CM | POA: Diagnosis not present

## 2021-05-16 DIAGNOSIS — G90512 Complex regional pain syndrome I of left upper limb: Secondary | ICD-10-CM | POA: Diagnosis not present

## 2021-05-16 DIAGNOSIS — Z7689 Persons encountering health services in other specified circumstances: Secondary | ICD-10-CM | POA: Diagnosis not present

## 2021-05-16 DIAGNOSIS — F32A Depression, unspecified: Secondary | ICD-10-CM | POA: Diagnosis not present

## 2021-05-16 DIAGNOSIS — Z114 Encounter for screening for human immunodeficiency virus [HIV]: Secondary | ICD-10-CM | POA: Diagnosis not present

## 2021-05-16 DIAGNOSIS — E559 Vitamin D deficiency, unspecified: Secondary | ICD-10-CM | POA: Diagnosis not present

## 2021-05-16 DIAGNOSIS — Z1159 Encounter for screening for other viral diseases: Secondary | ICD-10-CM | POA: Diagnosis not present

## 2021-05-17 LAB — CMP14+EGFR
ALT: 19 IU/L (ref 0–32)
AST: 20 IU/L (ref 0–40)
Albumin/Globulin Ratio: 1.5 (ref 1.2–2.2)
Albumin: 4.7 g/dL (ref 3.8–4.8)
Alkaline Phosphatase: 92 IU/L (ref 44–121)
BUN/Creatinine Ratio: 15 (ref 12–28)
BUN: 13 mg/dL (ref 8–27)
Bilirubin Total: 0.4 mg/dL (ref 0.0–1.2)
CO2: 19 mmol/L — ABNORMAL LOW (ref 20–29)
Calcium: 9.8 mg/dL (ref 8.7–10.3)
Chloride: 101 mmol/L (ref 96–106)
Creatinine, Ser: 0.88 mg/dL (ref 0.57–1.00)
Globulin, Total: 3.1 g/dL (ref 1.5–4.5)
Glucose: 92 mg/dL (ref 70–99)
Potassium: 4.7 mmol/L (ref 3.5–5.2)
Sodium: 137 mmol/L (ref 134–144)
Total Protein: 7.8 g/dL (ref 6.0–8.5)
eGFR: 74 mL/min/{1.73_m2} (ref >59)

## 2021-05-17 LAB — CBC WITH DIFFERENTIAL/PLATELET
Basophils Absolute: 0 10*3/uL (ref 0.0–0.2)
Basos: 0 %
EOS (ABSOLUTE): 0 10*3/uL (ref 0.0–0.4)
Eos: 0 %
Hematocrit: 45 % (ref 34.0–46.6)
Hemoglobin: 15.4 g/dL (ref 11.1–15.9)
Immature Grans (Abs): 0 10*3/uL (ref 0.0–0.1)
Immature Granulocytes: 0 %
Lymphocytes Absolute: 1.4 10*3/uL (ref 0.7–3.1)
Lymphs: 15 %
MCH: 32.8 pg (ref 26.6–33.0)
MCHC: 34.2 g/dL (ref 31.5–35.7)
MCV: 96 fL (ref 79–97)
Monocytes Absolute: 0.4 10*3/uL (ref 0.1–0.9)
Monocytes: 5 %
Neutrophils Absolute: 7.6 10*3/uL — ABNORMAL HIGH (ref 1.4–7.0)
Neutrophils: 80 %
Platelets: 391 10*3/uL (ref 150–450)
RBC: 4.69 x10E6/uL (ref 3.77–5.28)
RDW: 12.1 % (ref 11.7–15.4)
WBC: 9.6 10*3/uL (ref 3.4–10.8)

## 2021-05-17 LAB — LIPID PANEL
Chol/HDL Ratio: 3.9 ratio (ref 0.0–4.4)
Cholesterol, Total: 234 mg/dL — ABNORMAL HIGH (ref 100–199)
HDL: 60 mg/dL (ref >39)
LDL Chol Calc (NIH): 160 mg/dL — ABNORMAL HIGH (ref 0–99)
Triglycerides: 83 mg/dL (ref 0–149)
VLDL Cholesterol Cal: 14 mg/dL (ref 5–40)

## 2021-05-17 LAB — HEPATITIS C ANTIBODY: Hep C Virus Ab: <0.1 s/co ratio (ref 0.0–0.9)

## 2021-05-17 LAB — VITAMIN D 25 HYDROXY (VIT D DEFICIENCY, FRACTURES): Vit D, 25-Hydroxy: 14 ng/mL — ABNORMAL LOW (ref 30.0–100.0)

## 2021-05-17 LAB — HIV ANTIBODY (ROUTINE TESTING W REFLEX): HIV Screen 4th Generation wRfx: NONREACTIVE

## 2021-05-17 LAB — TSH: TSH: 2.87 u[IU]/mL (ref 0.450–4.500)

## 2021-05-20 ENCOUNTER — Ambulatory Visit: Payer: Medicare Other | Admitting: Internal Medicine

## 2021-05-27 ENCOUNTER — Ambulatory Visit (INDEPENDENT_AMBULATORY_CARE_PROVIDER_SITE_OTHER): Payer: Medicare Other | Admitting: Internal Medicine

## 2021-05-27 ENCOUNTER — Encounter: Payer: Self-pay | Admitting: Internal Medicine

## 2021-05-27 ENCOUNTER — Encounter (INDEPENDENT_AMBULATORY_CARE_PROVIDER_SITE_OTHER): Payer: Self-pay

## 2021-05-27 ENCOUNTER — Other Ambulatory Visit: Payer: Self-pay

## 2021-05-27 VITALS — BP 122/78 | HR 96 | Resp 18 | Ht <= 58 in | Wt 100.1 lb

## 2021-05-27 DIAGNOSIS — F419 Anxiety disorder, unspecified: Secondary | ICD-10-CM | POA: Diagnosis not present

## 2021-05-27 DIAGNOSIS — Z720 Tobacco use: Secondary | ICD-10-CM

## 2021-05-27 DIAGNOSIS — F17210 Nicotine dependence, cigarettes, uncomplicated: Secondary | ICD-10-CM | POA: Diagnosis not present

## 2021-05-27 DIAGNOSIS — F3341 Major depressive disorder, recurrent, in partial remission: Secondary | ICD-10-CM

## 2021-05-27 DIAGNOSIS — G90512 Complex regional pain syndrome I of left upper limb: Secondary | ICD-10-CM | POA: Diagnosis not present

## 2021-05-27 DIAGNOSIS — Z1211 Encounter for screening for malignant neoplasm of colon: Secondary | ICD-10-CM

## 2021-05-27 DIAGNOSIS — E559 Vitamin D deficiency, unspecified: Secondary | ICD-10-CM

## 2021-05-27 DIAGNOSIS — E782 Mixed hyperlipidemia: Secondary | ICD-10-CM | POA: Diagnosis not present

## 2021-05-27 MED ORDER — VITAMIN D (ERGOCALCIFEROL) 1.25 MG (50000 UNIT) PO CAPS: 50000.0000 [IU] | ORAL_CAPSULE | ORAL | 1 refills | Status: DC

## 2021-05-27 NOTE — Patient Instructions (Addendum)
Please continue to take supplements.  Please try to cut down to quit smoking.  Please try to avoid oily and fried food.

## 2021-05-27 NOTE — Progress Notes (Signed)
Established Patient Office Visit  Subjective:  Patient ID: Jean Perez, female    DOB: [birth year]  Age: 66 y.o. MRN: [id number]  CC:  Chief Complaint  Patient presents with   Follow-up    4 month follow up depression and anxiety is feeling much better stopped taking medicine and still feels ok     HPI Jean Perez is a 66 y.o. 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  She had been feeling depressed and anxious and was switched to Remeron from Lexapro in the last visit.  She did not try Remeron, and also stopped taking Lexapro.  She was taking gabapentin for history of complex regional pain syndrome of left UE, which she tapered and stopped taking.  She believed that it was making her foggy and since stopping it, her mood has improved.  She denies any anhedonia, anxiety, SI or HI.  She continues to have severe pain over left UE.  She prefers to not take gabapentin and does not want to take any other medicine for complex regional pain syndrome of left UE.  She has visited urology for urinary frequency.  She was given Oxybutynin, which she is agreeing to take PRN.  She has not taken it recently.  She denies any dysuria, hematuria or flank pain currently.  Blood tests were reviewed and discussed with the patient in detail.  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     Past Surgical History:  Procedure Laterality Date   arm surgery     CARPAL TUNNEL RELEASE     left elbow      Family History  Problem Relation Age of Onset   Glaucoma Paternal Grandfather    Heart disease Father    Hypertension Mother    Breast cancer Maternal Aunt    Stroke Other    Heart disease Other    High blood pressure  Other     Social History   Socioeconomic History   Marital status: Soil scientist    Spouse name: Not on file   Number of children: 2   Years of education: HS   Highest education level: Not on file  Occupational History    Employer: UNEMPLOYED    Comment: disabled  Tobacco Use   Smoking status: Every Day    Packs/day: 0.25    Types: Cigarettes   Smokeless tobacco: Never  Vaping Use   Vaping Use: Never used  Substance and Sexual Activity   Alcohol use: Yes    Comment: occ   Drug use: No   Sexual activity: Not Currently    Birth control/protection: Post-menopausal  Other Topics Concern   Not on file  Social History Narrative   Patient is disabled. And she is divorced.    Caffeine one pot of coffee daily.   Left handed.   Social Determinants of Health   Financial Resource Strain: Medium Risk   Difficulty of Paying Living Expenses: Somewhat hard  Food Insecurity: No Food Insecurity   Worried About Charity fundraiser in the Last Year: Never true   Ran Out of Food in the Last Year: Never true  Transportation Needs: No Transportation Needs   Lack  of Transportation (Medical): No   Lack of Transportation (Non-Medical): No  Physical Activity: Sufficiently Active   Days of Exercise per Week: 5 days   Minutes of Exercise per Session: 30 min  Stress: Stress Concern Present   Feeling of Stress : Very much  Social Connections: Moderately Integrated   Frequency of Communication with Friends and Family: More than three times a week   Frequency of Social Gatherings with Friends and Family: Once a week   Attends Religious Services: 1 to 4 times per year   Active Member of [employer] or Organizations: No   Attends Music therapist: Never   Marital Status: Living with partner  Intimate Partner Violence: Not At Risk   Fear of Current or Ex-Partner: No   Emotionally Abused: No   Physically Abused: No   Sexually Abused: No    Outpatient Medications Prior to Visit   Medication Sig Dispense Refill   calcium-vitamin D (OSCAL WITH D) 500-200 MG-UNIT per tablet Take 1 tablet by mouth daily.     oxybutynin (DITROPAN XL) 15 MG 24 hr tablet Take 1 tablet (15 mg total) by mouth daily. 90 tablet 1   phenazopyridine (PYRIDIUM) 97 MG tablet Take 1 tablet (97 mg total) by mouth 3 (three) times daily as needed for pain. 10 tablet 0   PRESCRIPTION MEDICATION Apply 1 application topically daily as needed (for pain). PT GETS A COMPOUND PRESCRIPTION AT [facility]USTOM [facility]ARE. KETAMINE 10% LIDOCAINE 5% AMITRIPALINE 2%     gabapentin (NEURONTIN) 400 MG capsule Take 1 capsule (400 mg total) by mouth 2 (two) times daily. (Patient not taking: Reported on 05/27/2021) 60 capsule 5   mirtazapine (REMERON) 15 MG tablet Take 1 tablet (15 mg total) by mouth at bedtime. (Patient not taking: Reported on 05/27/2021) 30 tablet 3   No facility-administered medications prior to visit.    Allergies  Allergen Reactions   Penicillins Hives, Rash and Other (See Comments)    Has patient had a PCN reaction causing immediate rash, facial/tongue/throat swelling, SOB or lightheadedness with hypotension: Yes Has patient had a PCN reaction causing severe rash involving mucus membranes or skin necrosis: Yes Has patient had a PCN reaction that required hospitalization No Has patient had a PCN reaction occurring within the last 10 years: No If all of the above answers are "NO", then may proceed with Cephalosporin use.   Other Reaction: BRONCHOSPASM    ROS Review of Systems  Constitutional:  Negative for chills and fever.  HENT:  Negative for congestion, sinus pressure, sinus pain and sore throat.   Eyes:  Negative for pain and discharge.  Respiratory:  Negative for cough and shortness of breath.   Cardiovascular:  Negative for chest pain and palpitations.  Gastrointestinal:  Negative for abdominal pain, diarrhea, nausea and vomiting.  Endocrine: Negative for polydipsia and polyuria.  Genitourinary:   Negative for dysuria and hematuria.  Musculoskeletal:  Negative for neck pain and neck stiffness.       LUE pain  Skin:  Negative for rash.  Neurological:  Negative for dizziness and weakness.  Psychiatric/Behavioral:  Negative for agitation and behavioral problems.      Objective:    Physical Exam Vitals reviewed.  Constitutional:      General: She is not in acute distress.    Appearance: She is not diaphoretic.  HENT:     Head: Normocephalic and atraumatic.     Nose: Nose normal.     Mouth/Throat:     Mouth: Mucous membranes  are moist.  Eyes:     General: No scleral icterus.    Extraocular Movements: Extraocular movements intact.  Cardiovascular:     Rate and Rhythm: Normal rate and regular rhythm.     Pulses: Normal pulses.     Heart sounds: Normal heart sounds. No murmur heard. Pulmonary:     Breath sounds: Normal breath sounds. No wheezing or rales.  Musculoskeletal:     Cervical back: Neck supple. No tenderness.     Right lower leg: No edema.     Left lower leg: No edema.  Skin:    General: Skin is warm.     Findings: No rash.  Neurological:     General: No focal deficit present.     Mental Status: She is alert and oriented to person, place, and time.     Sensory: No sensory deficit.     Motor: No weakness.  Psychiatric:        Mood and Affect: Mood normal.        Behavior: Behavior normal.    BP 122/78 (BP Location: Right Arm, Patient Position: Sitting, Cuff Size: Normal)    Pulse 96    Resp 18    Ht '4\' 10"'  (1.473 m)    Wt 100 lb 1.9 oz (45.4 kg)    LMP  (LMP Unknown)    SpO2 96%    BMI 20.93 kg/m  Wt Readings from Last 3 Encounters:  05/27/21 100 lb 1.9 oz (45.4 kg)  03/05/21 97 lb (44 kg)  02/11/21 97 lb (44 kg)    Lab Results  Component Value Date   TSH 2.870 05/16/2021   Lab Results  Component Value Date   WBC 9.6 05/16/2021   HGB 15.4 05/16/2021   HCT 45.0 05/16/2021   MCV 96 05/16/2021   PLT 391 05/16/2021   Lab Results  Component Value  Date   NA 137 05/16/2021   K 4.7 05/16/2021   CO2 19 (L) 05/16/2021   GLUCOSE 92 05/16/2021   BUN 13 05/16/2021   CREATININE 0.88 05/16/2021   BILITOT 0.4 05/16/2021   ALKPHOS 92 05/16/2021   AST 20 05/16/2021   ALT 19 05/16/2021   PROT 7.8 05/16/2021   ALBUMIN 4.7 05/16/2021   CALCIUM 9.8 05/16/2021   ANIONGAP 7 02/15/2016   EGFR 74 05/16/2021   Lab Results  Component Value Date   CHOL 234 (H) 05/16/2021   Lab Results  Component Value Date   HDL 60 05/16/2021   Lab Results  Component Value Date   LDLCALC 160 (H) 05/16/2021   Lab Results  Component Value Date   TRIG 83 05/16/2021   Lab Results  Component Value Date   CHOLHDL 3.9 05/16/2021   No results found for: HGBA1C    Assessment & Plan:   Problem List Items Addressed This Visit       Nervous and Auditory   Complex regional pain syndrome of left upper extremity    Was well-controlled with Gabapentin, but was having dizziness, fatigue and anxiety with it, has stopped taking it Has had stellate cervical blocks in the past, does not see Neurology now Does not want to take Lyrica for now Wants to manage without medication for now        Other   Depression - Primary    [place] Office Visit from 05/27/2021 in [facility]  PHQ-2 Total Score 0    Was uncontrolled with Lexapro Had switched to Remeron to help with appetite  and sleep as well      Anxiety    Has stopped taking Lexapro Feels better since she stopped taking Gabapentin      Vitamin D deficiency   Relevant Medications   Vitamin D, Ergocalciferol, (DRISDOL) 1.25 MG (50000 UNIT) CAPS capsule   Tobacco abuse    Smokes about 5 cigarettes/day  Asked about quitting: confirms that she currently smokes cigarettes Advise to quit smoking: Educated about QUITTING to reduce the risk of cancer, cardio and cerebrovascular disease. Assess willingness: Unwilling to quit at this time, but is working on cutting back. Assist with  counseling and pharmacotherapy: Counseled for 5 minutes and literature provided. Uses nicotine gums. Arrange for follow up: Follow up in 3 months and continue to offer help.      Mixed hyperlipidemia    Lipid profile reviewed Advised to follow low cholesterol diet      Other Visit Diagnoses     Screen for colon cancer       Relevant Orders   Cologuard       Meds ordered this encounter  Medications   Vitamin D, Ergocalciferol, (DRISDOL) 1.25 MG (50000 UNIT) CAPS capsule    Sig: Take 1 capsule (50,000 Units total) by mouth every 7 (seven) days.    Dispense:  12 capsule    Refill:  1    Follow-up: Return in about 6 months (around 11/24/2021).    Lindell Spar, MD

## 2021-05-27 NOTE — Assessment & Plan Note (Signed)
Lipid profile reviewed Advised to follow low cholesterol diet 

## 2021-05-27 NOTE — Assessment & Plan Note (Signed)
Has stopped taking Lexapro Feels better since she stopped taking Gabapentin

## 2021-05-27 NOTE — Assessment & Plan Note (Signed)
Smokes about 5 cigarettes/day  Asked about quitting: confirms that she currently smokes cigarettes Advise to quit smoking: Educated about QUITTING to reduce the risk of cancer, cardio and cerebrovascular disease. Assess willingness: Unwilling to quit at this time, but is working on cutting back. Assist with counseling and pharmacotherapy: Counseled for 5 minutes and literature provided. Uses nicotine gums. Arrange for follow up: Follow up in 3 months and continue to offer help. 

## 2021-05-27 NOTE — Assessment & Plan Note (Signed)
Was well-controlled with Gabapentin, but was having dizziness, fatigue and anxiety with it, has stopped taking it Has had stellate cervical blocks in the past, does not see Neurology now Does not want to take Lyrica for now Wants to manage without medication for now 

## 2021-05-27 NOTE — Assessment & Plan Note (Signed)
[place] Office Visit from 05/27/2021 in [facility]  PHQ-2 Total Score 0       Was uncontrolled with Lexapro Had switched to Remeron to help with appetite and sleep as well

## 2021-11-05 ENCOUNTER — Other Ambulatory Visit: Payer: Self-pay | Admitting: Internal Medicine

## 2021-11-05 DIAGNOSIS — E559 Vitamin D deficiency, unspecified: Secondary | ICD-10-CM

## 2021-12-16 ENCOUNTER — Ambulatory Visit (INDEPENDENT_AMBULATORY_CARE_PROVIDER_SITE_OTHER): Payer: Medicare Other | Admitting: Internal Medicine

## 2021-12-16 ENCOUNTER — Encounter: Payer: Self-pay | Admitting: Internal Medicine

## 2021-12-16 ENCOUNTER — Telehealth: Payer: Self-pay | Admitting: Internal Medicine

## 2021-12-16 VITALS — BP 136/84 | HR 91 | Resp 18 | Ht <= 58 in | Wt 90.4 lb

## 2021-12-16 DIAGNOSIS — F17210 Nicotine dependence, cigarettes, uncomplicated: Secondary | ICD-10-CM | POA: Diagnosis not present

## 2021-12-16 DIAGNOSIS — Z720 Tobacco use: Secondary | ICD-10-CM

## 2021-12-16 DIAGNOSIS — E559 Vitamin D deficiency, unspecified: Secondary | ICD-10-CM

## 2021-12-16 DIAGNOSIS — F3341 Major depressive disorder, recurrent, in partial remission: Secondary | ICD-10-CM | POA: Diagnosis not present

## 2021-12-16 DIAGNOSIS — G90512 Complex regional pain syndrome I of left upper limb: Secondary | ICD-10-CM

## 2021-12-16 DIAGNOSIS — E782 Mixed hyperlipidemia: Secondary | ICD-10-CM

## 2021-12-16 NOTE — Assessment & Plan Note (Signed)
Takes Vitamin D supplements Check Vitamin D level 

## 2021-12-16 NOTE — Progress Notes (Signed)
Established Patient Office Visit  Subjective:  Patient ID: Jean Perez, female    DOB: [birth year]  Age: 66 y.o. MRN: [id number]  CC:  Chief Complaint  Patient presents with   Follow-up    6 month follow up     HPI Jean Perez is a 66 y.o. 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  She continues to have severe pain over left UE.  She prefers to not take gabapentin and does not want to take any other medicine for complex regional pain syndrome of left UE.  She has mild numbness over left UE, but strength is intact.  She did not take oxybutynin for urinary frequency.  She denies any dysuria, hematuria or flank pain currently.   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     Past Surgical History:  Procedure Laterality Date   arm surgery     CARPAL TUNNEL RELEASE     left elbow      Family History  Problem Relation Age of Onset   Glaucoma Paternal Grandfather    Heart disease Father    Hypertension Mother    Breast cancer Maternal Aunt    Stroke Other    Heart disease Other    High blood pressure Other     Social History   Socioeconomic History   Marital status: Soil scientist    Spouse name: Not on file   Number of children: 2   Years of education: HS   Highest education level: Not on file  Occupational History    Employer: UNEMPLOYED    Comment: disabled  Tobacco Use   Smoking status: Every Day    Packs/day: 0.25    Types: Cigarettes   Smokeless tobacco: Never  Vaping Use   Vaping Use: Never used  Substance and Sexual Activity   Alcohol use: Yes    Comment: occ   Drug use: No   Sexual activity: Not Currently    Birth control/protection: Post-menopausal  Other Topics  Concern   Not on file  Social History Narrative   Patient is disabled. And she is divorced.    Caffeine one pot of coffee daily.   Left handed.   Social Determinants of Health   Financial Resource Strain: Medium Risk (03/05/2021)   Overall Financial Resource Strain (CARDIA)    Difficulty of Paying Living Expenses: Somewhat hard  Food Insecurity: No Food Insecurity (03/05/2021)   Hunger Vital Sign    Worried About Running Out of Food in the Last Year: Never true    Ran Out of Food in the Last Year: Never true  Transportation Needs: No Transportation Needs (03/05/2021)   PRAPARE - Hydrologist (Medical): No    Lack of Transportation (Non-Medical): No  Physical Activity: Sufficiently Active (03/05/2021)   Exercise Vital Sign    Days of Exercise per Week: 5 days    Minutes of Exercise per Session: 30 min  Stress: Stress Concern Present (03/05/2021)   [place]    Feeling of Stress : Very much  Social Connections: Moderately Integrated (03/05/2021)   Social Connection and Isolation Panel [NHANES]  Frequency of Communication with Friends and Family: More than three times a week    Frequency of Social Gatherings with Friends and Family: Once a week    Attends Religious Services: 1 to 4 times per year    Active Member of [employer] or Organizations: No    Attends Banker Meetings: Never    Marital Status: Living with partner  Recent Concern: Social Connections - Socially Isolated (01/17/2021)   Social Connection and Isolation Panel [NHANES]    Frequency of Communication with Friends and Family: Once a week    Frequency of Social Gatherings with Friends and Family: Never    Attends Religious Services: Never    Database administrator or Organizations: No    Attends Banker Meetings: Never    Marital Status: Living with partner  Intimate Partner Violence: Not At Risk  (03/05/2021)   Humiliation, Afraid, Rape, and Kick questionnaire    Fear of Current or Ex-Partner: No    Emotionally Abused: No    Physically Abused: No    Sexually Abused: No    Outpatient Medications Prior to Visit  Medication Sig Dispense Refill   calcium-vitamin D (OSCAL WITH D) 500-200 MG-UNIT per tablet Take 1 tablet by mouth daily.     PRESCRIPTION MEDICATION Apply 1 application topically daily as needed (for pain). PT GETS A COMPOUND PRESCRIPTION AT [facility]. KETAMINE 10% LIDOCAINE 5% AMITRIPALINE 2%     Vitamin D, Ergocalciferol, (DRISDOL) 1.25 MG (50000 UNIT) CAPS capsule TAKE 1 CAPSULE BY MOUTH EVERY 7 DAYS 12 capsule 1   oxybutynin (DITROPAN XL) 15 MG 24 hr tablet Take 1 tablet (15 mg total) by mouth daily. 90 tablet 1   phenazopyridine (PYRIDIUM) 97 MG tablet Take 1 tablet (97 mg total) by mouth 3 (three) times daily as needed for pain. 10 tablet 0   No facility-administered medications prior to visit.    Allergies  Allergen Reactions   Penicillins Hives, Rash and Other (See Comments)    Has patient had a PCN reaction causing immediate rash, facial/tongue/throat swelling, SOB or lightheadedness with hypotension: Yes Has patient had a PCN reaction causing severe rash involving mucus membranes or skin necrosis: Yes Has patient had a PCN reaction that required hospitalization No Has patient had a PCN reaction occurring within the last 10 years: No If all of the above answers are "NO", then may proceed with Cephalosporin use.   Other Reaction: BRONCHOSPASM    ROS Review of Systems  Constitutional:  Negative for chills and fever.  HENT:  Negative for congestion, sinus pressure, sinus pain and sore throat.   Eyes:  Negative for pain and discharge.  Respiratory:  Negative for cough and shortness of breath.   Cardiovascular:  Negative for chest pain and palpitations.  Gastrointestinal:  Negative for abdominal pain, diarrhea, nausea and vomiting.  Endocrine: Negative  for polydipsia and polyuria.  Genitourinary:  Negative for dysuria and hematuria.  Musculoskeletal:  Negative for neck pain and neck stiffness.       LUE pain  Skin:  Negative for rash.  Neurological:  Negative for dizziness and weakness.  Psychiatric/Behavioral:  Negative for agitation and behavioral problems.       Objective:    Physical Exam Vitals reviewed.  Constitutional:      General: She is not in acute distress.    Appearance: She is not diaphoretic.  HENT:     Head: Normocephalic and atraumatic.     Nose: Nose normal.  Mouth/Throat:     Mouth: Mucous membranes are moist.  Eyes:     General: No scleral icterus.    Extraocular Movements: Extraocular movements intact.  Cardiovascular:     Rate and Rhythm: Normal rate and regular rhythm.     Pulses: Normal pulses.     Heart sounds: Normal heart sounds. No murmur heard. Pulmonary:     Breath sounds: Normal breath sounds. No wheezing or rales.  Musculoskeletal:     Cervical back: Neck supple. No tenderness.     Right lower leg: No edema.     Left lower leg: No edema.  Skin:    General: Skin is warm.     Findings: No rash.  Neurological:     General: No focal deficit present.     Mental Status: She is alert and oriented to person, place, and time.     Sensory: No sensory deficit.     Motor: No weakness.  Psychiatric:        Mood and Affect: Mood normal.        Behavior: Behavior normal.     BP 136/84 (BP Location: Right Arm, Patient Position: Sitting, Cuff Size: Normal)   Pulse 91   Resp 18   Ht _0  (1.473 m)   Wt 90 lb 6.4 oz (41 kg)   LMP  (LMP Unknown)   SpO2 98%   BMI 18.89 kg/m  Wt Readings from Last 3 Encounters:  12/16/21 90 lb 6.4 oz (41 kg)  05/27/21 100 lb 1.9 oz (45.4 kg)  03/05/21 97 lb (44 kg)    Lab Results  Component Value Date   TSH 2.870 05/16/2021   Lab Results  Component Value Date   WBC 9.6 05/16/2021   HGB 15.4 05/16/2021   HCT 45.0 05/16/2021   MCV 96 05/16/2021    PLT 391 05/16/2021   Lab Results  Component Value Date   NA 137 05/16/2021   K 4.7 05/16/2021   CO2 19 (L) 05/16/2021   GLUCOSE 92 05/16/2021   BUN 13 05/16/2021   CREATININE 0.88 05/16/2021   BILITOT 0.4 05/16/2021   ALKPHOS 92 05/16/2021   AST 20 05/16/2021   ALT 19 05/16/2021   PROT 7.8 05/16/2021   ALBUMIN 4.7 05/16/2021   CALCIUM 9.8 05/16/2021   ANIONGAP 7 02/15/2016   EGFR 74 05/16/2021   Lab Results  Component Value Date   CHOL 234 (H) 05/16/2021   Lab Results  Component Value Date   HDL 60 05/16/2021   Lab Results  Component Value Date   LDLCALC 160 (H) 05/16/2021   Lab Results  Component Value Date   TRIG 83 05/16/2021   Lab Results  Component Value Date   CHOLHDL 3.9 05/16/2021   No results found for: "HGBA1C"    Assessment & Plan:   Problem List Items Addressed This Visit       Nervous and Auditory   Complex regional pain syndrome of left upper extremity - Primary    Was well-controlled with Gabapentin, but was having dizziness, fatigue and anxiety with it, has stopped taking it Has had stellate cervical blocks in the past, does not see Neurology now Does not want to take Lyrica for now Wants to manage without medication for now      Relevant Orders   TSH   CMP14+EGFR   CBC with Differential/Platelet     Other   Depression    Flowsheet Row Office Visit from 12/16/2021 in [facility]  PHQ-2 Total Score 0  Well controlled currently Was on SSRI in the past      Relevant Orders   TSH   CMP14+EGFR   CBC with Differential/Platelet   Vitamin D deficiency    Takes Vitamin D supplements Check Vitamin D level      Relevant Orders   VITAMIN D 25 Hydroxy (Vit-D Deficiency, Fractures)   Tobacco abuse    Smokes about 5 cigarettes/day  Asked about quitting: confirms that she currently smokes cigarettes Advise to quit smoking: Educated about QUITTING to reduce the risk of cancer, cardio and cerebrovascular disease. Assess  willingness: Unwilling to quit at this time, but is working on cutting back. Assist with counseling and pharmacotherapy: Counseled for 5 minutes and literature provided. Uses nicotine gums. Arrange for follow up: Follow up in 3 months and continue to offer help.      Mixed hyperlipidemia    Lipid profile reviewed Advised to follow low cholesterol diet      Relevant Orders   Lipid panel    No orders of the defined types were placed in this encounter.   Follow-up: Return in about 6 months (around 06/18/2022) for HLD.    Lindell Spar, MD

## 2021-12-16 NOTE — Assessment & Plan Note (Signed)
Smokes about 5 cigarettes/day  Asked about quitting: confirms that she currently smokes cigarettes Advise to quit smoking: Educated about QUITTING to reduce the risk of cancer, cardio and cerebrovascular disease. Assess willingness: Unwilling to quit at this time, but is working on cutting back. Assist with counseling and pharmacotherapy: Counseled for 5 minutes and literature provided. Uses nicotine gums. Arrange for follow up: Follow up in 3 months and continue to offer help. 

## 2021-12-16 NOTE — Assessment & Plan Note (Signed)
Flowsheet Row Office Visit from 12/16/2021 in [facility]  PHQ-2 Total Score 0     Well controlled currently Was on SSRI in the past

## 2021-12-16 NOTE — Patient Instructions (Addendum)
Please take Vitamin D 5000 IU once daily once you are done with prescription Vitamin D.  Please try to cut down -> quit smoking.  Please get fasting blood tests done before the next visit.  Please consider getting Shingrix and Tdap vaccines at your local pharmacy.

## 2021-12-16 NOTE — Telephone Encounter (Signed)
NA NVM for pt there is no vaccine for RSV at this point in time can get flu shot late august if she wishes

## 2021-12-16 NOTE — Assessment & Plan Note (Signed)
Was well-controlled with Gabapentin, but was having dizziness, fatigue and anxiety with it, has stopped taking it Has had stellate cervical blocks in the past, does not see Neurology now Does not want to take Lyrica for now Wants to manage without medication for now 

## 2021-12-16 NOTE — Telephone Encounter (Signed)
Patient has received Tetanus and shingrix   Wants to know if needs RSV vaccine as well. Wants a call back in regard.

## 2021-12-16 NOTE — Assessment & Plan Note (Signed)
Lipid profile reviewed Advised to follow low cholesterol diet 

## 2022-01-16 ENCOUNTER — Ambulatory Visit (INDEPENDENT_AMBULATORY_CARE_PROVIDER_SITE_OTHER): Payer: Medicare Other | Admitting: Internal Medicine

## 2022-01-16 ENCOUNTER — Encounter: Payer: Self-pay | Admitting: Internal Medicine

## 2022-01-16 VITALS — BP 136/84 | HR 85 | Resp 18 | Ht <= 58 in | Wt 92.6 lb

## 2022-01-16 DIAGNOSIS — J209 Acute bronchitis, unspecified: Secondary | ICD-10-CM | POA: Diagnosis not present

## 2022-01-16 DIAGNOSIS — F17210 Nicotine dependence, cigarettes, uncomplicated: Secondary | ICD-10-CM

## 2022-01-16 DIAGNOSIS — Z720 Tobacco use: Secondary | ICD-10-CM

## 2022-01-16 MED ORDER — ALBUTEROL SULFATE HFA 108 (90 BASE) MCG/ACT IN AERS: 2.0000 | INHALATION_SPRAY | Freq: Four times a day (QID) | RESPIRATORY_TRACT | 1 refills | Status: AC | PRN

## 2022-01-16 MED ORDER — AZITHROMYCIN 250 MG PO TABS: ORAL_TABLET | ORAL | 0 refills | Status: AC

## 2022-01-16 MED ORDER — METHYLPREDNISOLONE 4 MG PO TBPK: ORAL_TABLET | ORAL | 0 refills | Status: DC

## 2022-01-16 NOTE — Assessment & Plan Note (Signed)
Likely acute bronchitis as she has cough and chest tightness Has been smoking, but has cut down Started Azithromycin as she has persistent symptoms despite symptomatic treatment with Aleve Medrol dosepak Robitussin PRN for cough Added Albuterol inhaler PRN for dyspnea/wheezing

## 2022-01-16 NOTE — Patient Instructions (Signed)
Please take Azithromycin and Prednisone as prescribed.  Please take Robitussin DM Max for cough.  Please use Albuterol inhaler for shortness of breath or wheezing.  Please try to cut down -> quit smoking.

## 2022-01-16 NOTE — Assessment & Plan Note (Signed)
Smokes about 3 cigarettes/day  Asked about quitting: confirms that she currently smokes cigarettes Advise to quit smoking: Educated about QUITTING to reduce the risk of cancer, cardio and cerebrovascular disease. Assess willingness: Unwilling to quit at this time, but is working on cutting back. Assist with counseling and pharmacotherapy: Counseled for 5 minutes and literature provided. Uses nicotine gums. Arrange for follow up: Follow up in 3 months and continue to offer help.

## 2022-01-16 NOTE — Progress Notes (Signed)
Acute Office Visit  Subjective:    Patient ID: Jean Perez, female    DOB: [birth year], 66 y.o.   MRN: [id number]  Chief Complaint  Patient presents with   Cough    Patient has had cough congestion since 01-03-22 no runny nose she has had headache daughter was sick right before this     HPI Patient is in today for complaint of cough and nasal congestion for the last 2 weeks.  She has noticed clear sputum and clear nasal drainage. She denies rhinorrhea, fever or chills.  She has mild sore throat and chest tightness at times.  Of note, she still smokes, but has cut 3 cigarettes/day.  She denies any chest pain or wheezing currently.  She reports that her daughter had similar symptoms before her symptoms started.  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     Past Surgical History:  Procedure Laterality Date   arm surgery     CARPAL TUNNEL RELEASE     left elbow      Family History  Problem Relation Age of Onset   Glaucoma Paternal Grandfather    Heart disease Father    Hypertension Mother    Breast cancer Maternal Aunt    Stroke Other    Heart disease Other    High blood pressure Other     Social History   Socioeconomic History   Marital status: Media planner    Spouse name: Not on file   Number of children: 2   Years of education: HS   Highest education level: Not on file  Occupational History    Employer: UNEMPLOYED    Comment: disabled  Tobacco Use   Smoking status: Every Day    Packs/day: 0.25    Types: Cigarettes   Smokeless tobacco: Never  Vaping Use   Vaping Use: Never used  Substance and Sexual Activity   Alcohol use: Yes    Comment: occ   Drug use: No   Sexual activity: Not Currently    Birth control/protection: Post-menopausal  Other Topics Concern   Not on file  Social History Narrative   Patient  is disabled. And she is divorced.    Caffeine one pot of coffee daily.   Left handed.   Social Determinants of Health   Financial Resource Strain: Medium Risk (03/05/2021)   Overall Financial Resource Strain (CARDIA)    Difficulty of Paying Living Expenses: Somewhat hard  Food Insecurity: No Food Insecurity (03/05/2021)   Hunger Vital Sign    Worried About Running Out of Food in the Last Year: Never true    Ran Out of Food in the Last Year: Never true  Transportation Needs: No Transportation Needs (03/05/2021)   PRAPARE - Administrator, Civil Service (Medical): No    Lack of Transportation (Non-Medical): No  Physical Activity: Sufficiently Active (03/05/2021)   Exercise Vital Sign    Days of Exercise per Week: 5 days    Minutes of Exercise per Session: 30 min  Stress: Stress Concern Present (03/05/2021)   [employer] of Occupational Health - Occupational Stress Questionnaire    Feeling of Stress : Very much  Social Connections: Moderately Integrated (03/05/2021)   Social Connection and Isolation Panel [NHANES]    Frequency of Communication with Friends and Family: More  than three times a week    Frequency of Social Gatherings with Friends and Family: Once a week    Attends Religious Services: 1 to 4 times per year    Active Member of [employer] or Organizations: No    Attends Banker Meetings: Never    Marital Status: Living with partner  Recent Concern: Social Connections - Socially Isolated (01/17/2021)   Social Connection and Isolation Panel [NHANES]    Frequency of Communication with Friends and Family: Once a week    Frequency of Social Gatherings with Friends and Family: Never    Attends Religious Services: Never    Database administrator or Organizations: No    Attends Banker Meetings: Never    Marital Status: Living with partner  Intimate Partner Violence: Not At Risk (03/05/2021)   Humiliation, Afraid, Rape, and Kick  questionnaire    Fear of Current or Ex-Partner: No    Emotionally Abused: No    Physically Abused: No    Sexually Abused: No    Outpatient Medications Prior to Visit  Medication Sig Dispense Refill   calcium-vitamin D (OSCAL WITH D) 500-200 MG-UNIT per tablet Take 1 tablet by mouth daily.     PRESCRIPTION MEDICATION Apply 1 application topically daily as needed (for pain). PT GETS A COMPOUND PRESCRIPTION AT [facility]. KETAMINE 10% LIDOCAINE 5% AMITRIPALINE 2%     Vitamin D, Ergocalciferol, (DRISDOL) 1.25 MG (50000 UNIT) CAPS capsule TAKE 1 CAPSULE BY MOUTH EVERY 7 DAYS 12 capsule 1   No facility-administered medications prior to visit.    Allergies  Allergen Reactions   Penicillins Hives, Rash and Other (See Comments)    Has patient had a PCN reaction causing immediate rash, facial/tongue/throat swelling, SOB or lightheadedness with hypotension: Yes Has patient had a PCN reaction causing severe rash involving mucus membranes or skin necrosis: Yes Has patient had a PCN reaction that required hospitalization No Has patient had a PCN reaction occurring within the last 10 years: No If all of the above answers are "NO", then may proceed with Cephalosporin use.   Other Reaction: BRONCHOSPASM    Review of Systems  Constitutional:  Negative for chills and fever.  HENT:  Positive for congestion, sinus pressure and sore throat. Negative for rhinorrhea.   Eyes:  Negative for pain and discharge.  Respiratory:  Positive for cough and chest tightness. Negative for shortness of breath.   Cardiovascular:  Negative for chest pain and palpitations.  Gastrointestinal:  Negative for abdominal pain, diarrhea, nausea and vomiting.  Endocrine: Negative for polydipsia and polyuria.  Genitourinary:  Negative for dysuria and hematuria.  Musculoskeletal:  Negative for neck pain and neck stiffness.       LUE pain  Skin:  Negative for rash.  Neurological:  Negative for dizziness and weakness.   Psychiatric/Behavioral:  Negative for agitation and behavioral problems.        Objective:    Physical Exam Vitals reviewed.  Constitutional:      General: She is not in acute distress.    Appearance: She is not diaphoretic.  HENT:     Head: Normocephalic and atraumatic.     Nose: Nose normal.     Mouth/Throat:     Mouth: Mucous membranes are moist.  Eyes:     General: No scleral icterus.    Extraocular Movements: Extraocular movements intact.  Cardiovascular:     Rate and Rhythm: Normal rate and regular rhythm.     Pulses: Normal pulses.  Heart sounds: Normal heart sounds. No murmur heard. Pulmonary:     Breath sounds: Normal breath sounds. No wheezing or rales.  Musculoskeletal:     Cervical back: Neck supple. No tenderness.     Right lower leg: No edema.     Left lower leg: No edema.  Skin:    General: Skin is warm.     Findings: No rash.  Neurological:     General: No focal deficit present.     Mental Status: She is alert and oriented to person, place, and time.     Sensory: No sensory deficit.     Motor: No weakness.  Psychiatric:        Mood and Affect: Mood normal.        Behavior: Behavior normal.     BP 136/84 (BP Location: Right Arm, Patient Position: Sitting, Cuff Size: Normal)   Pulse 85   Resp 18   Ht 4\' 10"  (1.473 m)   Wt 92 lb 9.6 oz (42 kg)   LMP  (LMP Unknown)   SpO2 97%   BMI 19.35 kg/m  Wt Readings from Last 3 Encounters:  01/16/22 92 lb 9.6 oz (42 kg)  12/16/21 90 lb 6.4 oz (41 kg)  05/27/21 100 lb 1.9 oz (45.4 kg)        Assessment & Plan:   Problem List Items Addressed This Visit       Respiratory   Acute bronchitis - Primary    Likely acute bronchitis as she has cough and chest tightness Has been smoking, but has cut down Started Azithromycin as she has persistent symptoms despite symptomatic treatment with Aleve Medrol dosepak Robitussin PRN for cough Added Albuterol inhaler PRN for dyspnea/wheezing      Relevant  Medications   azithromycin (ZITHROMAX) 250 MG tablet   methylPREDNISolone (MEDROL DOSEPAK) 4 MG TBPK tablet   albuterol (VENTOLIN HFA) 108 (90 Base) MCG/ACT inhaler     Other   Tobacco abuse    Smokes about 3 cigarettes/day  Asked about quitting: confirms that she currently smokes cigarettes Advise to quit smoking: Educated about QUITTING to reduce the risk of cancer, cardio and cerebrovascular disease. Assess willingness: Unwilling to quit at this time, but is working on cutting back. Assist with counseling and pharmacotherapy: Counseled for 5 minutes and literature provided. Uses nicotine gums. Arrange for follow up: Follow up in 3 months and continue to offer help.        Meds ordered this encounter  Medications   azithromycin (ZITHROMAX) 250 MG tablet    Sig: Take 2 tablets on day 1, then 1 tablet daily on days 2 through 5    Dispense:  6 tablet    Refill:  0   methylPREDNISolone (MEDROL DOSEPAK) 4 MG TBPK tablet    Sig: Take as package instructions.    Dispense:  1 each    Refill:  0   albuterol (VENTOLIN HFA) 108 (90 Base) MCG/ACT inhaler    Sig: Inhale 2 puffs into the lungs every 6 (six) hours as needed for wheezing or shortness of breath.    Dispense:  8 g    Refill:  1    Okay to substitute to generic/formulary Albuterol.     05/29/21, MD

## 2022-01-22 IMAGING — MG MM DIGITAL SCREENING BILAT W/ TOMO AND CAD
8 series · 9 of 24 positions shown · non-contrast
Comparison: Previous exam(s).

CLINICAL DATA: Screening.

EXAM:
DIGITAL SCREENING BILATERAL MAMMOGRAM WITH TOMOSYNTHESIS AND CAD
TECHNIQUE: Bilateral screening digital craniocaudal and mediolateral oblique
mammograms were obtained. Bilateral screening digital breast
tomosynthesis was performed. The images were evaluated with
computer-aided detection.

[R CC synth-2D]
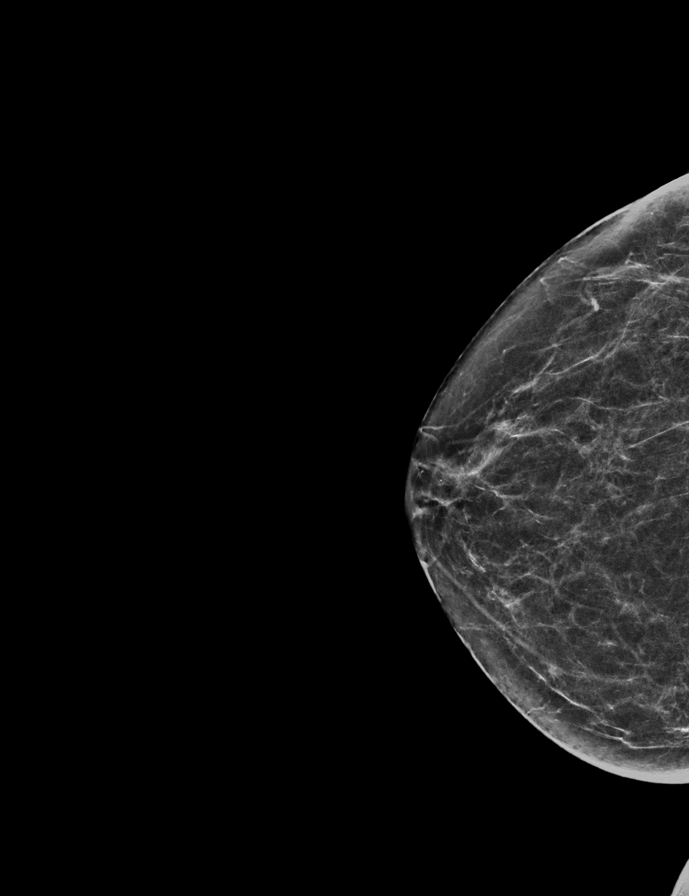

[L MLO synth-2D]
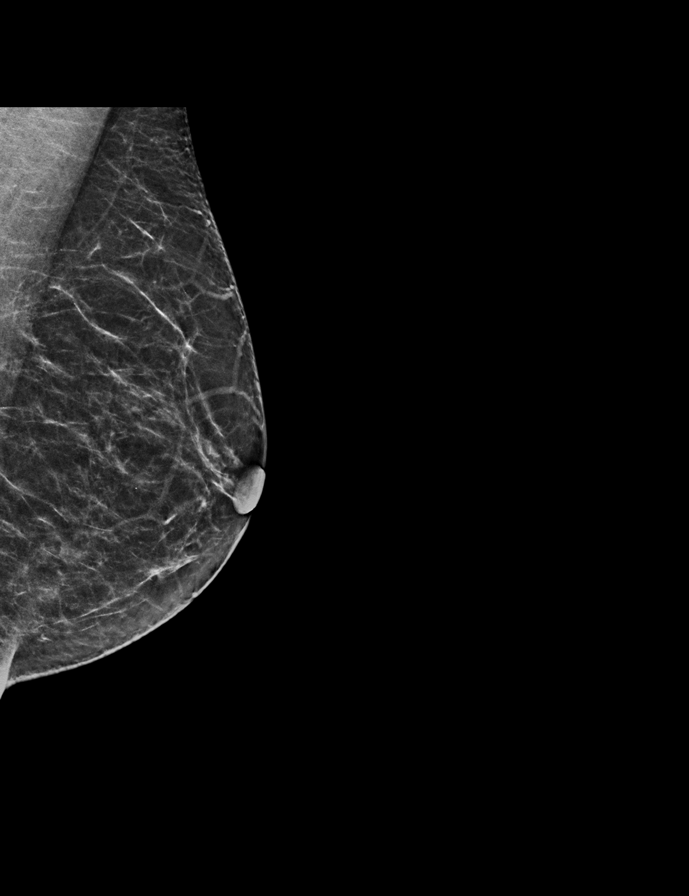

[R MLO synth-2D]
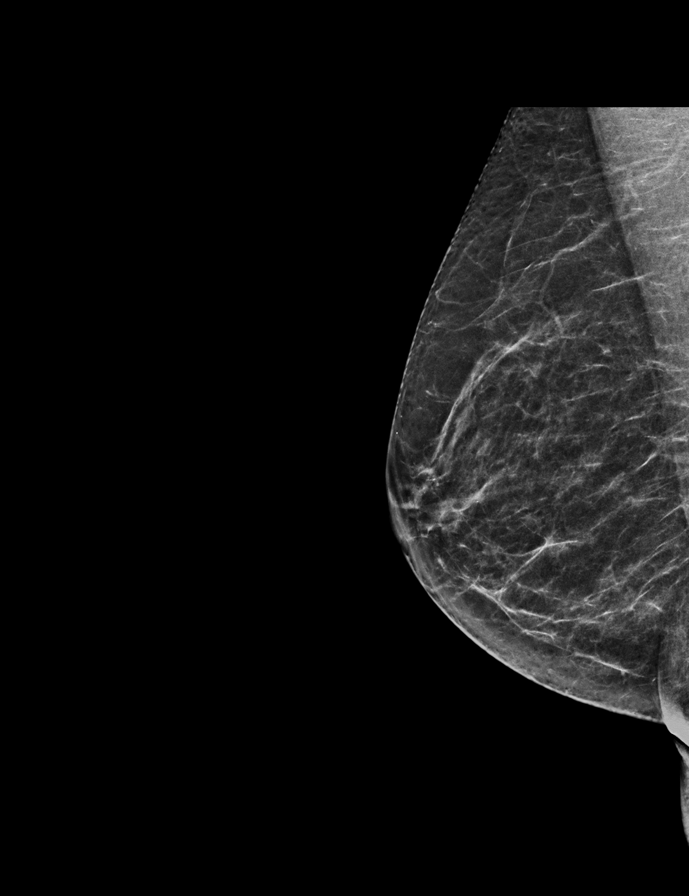

[L CC synth-2D]
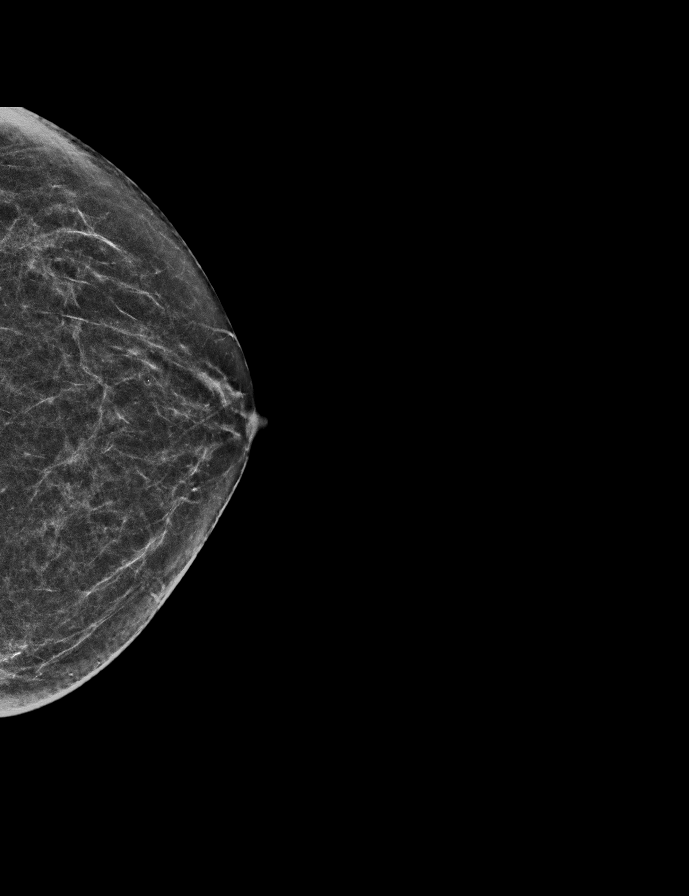

[L MLO tomo · 2 of 53 frames shown]
[frame 18/53]
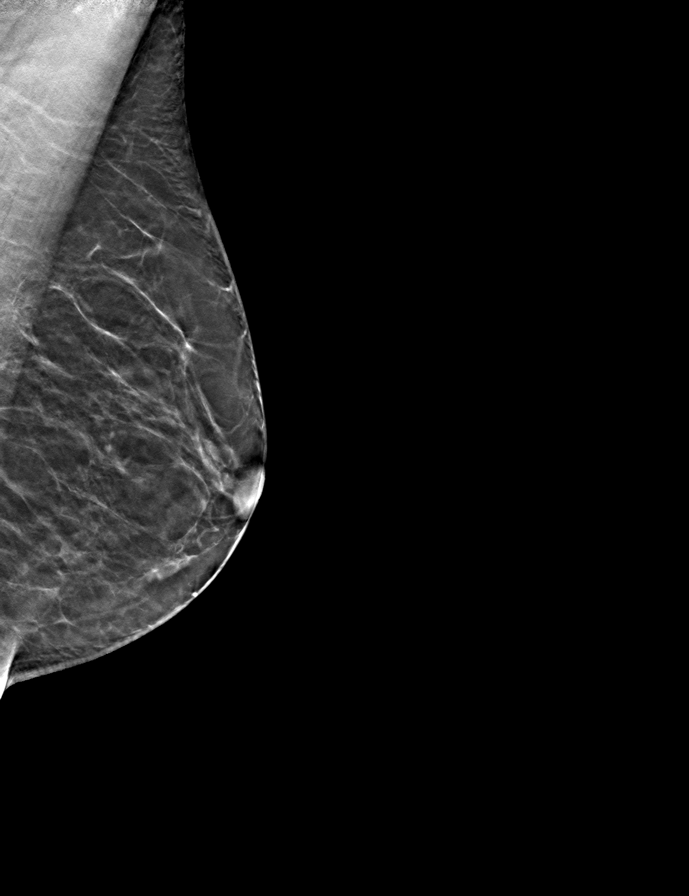
[frame 27/53]
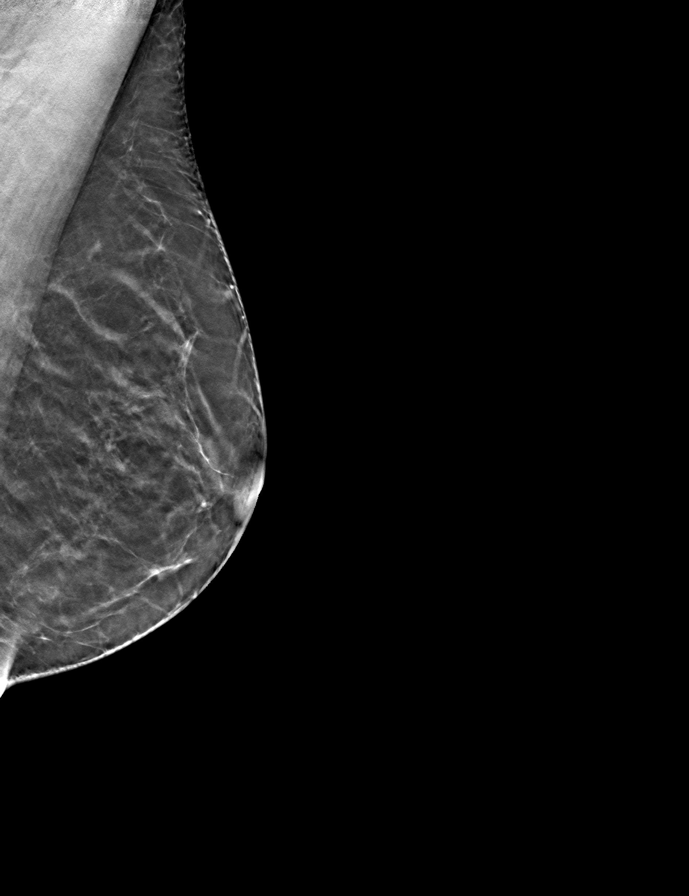

[R MLO tomo · tomo slice 28/55.0]
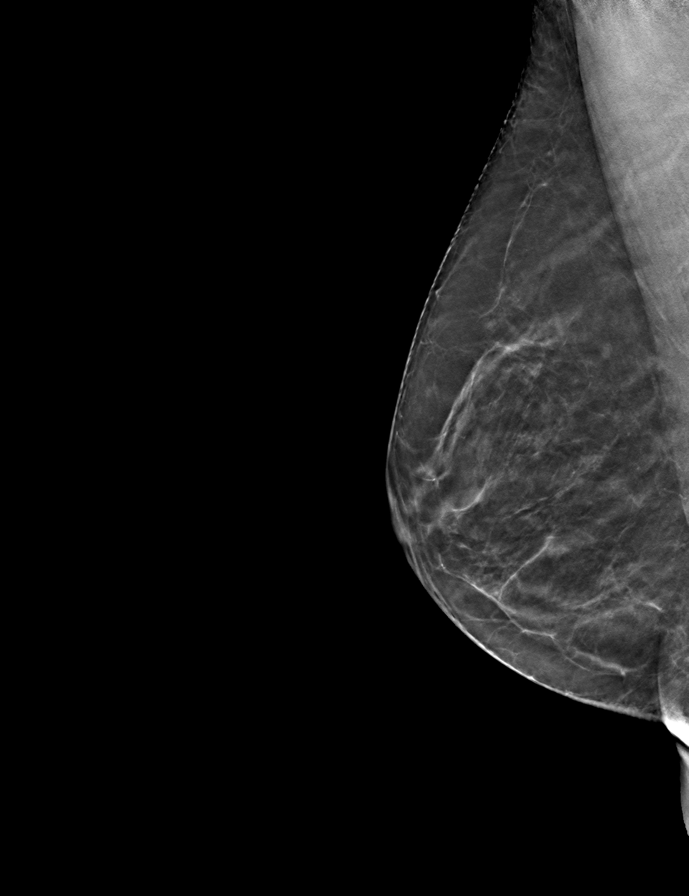

[L CC tomo · tomo slice 26/51.0]
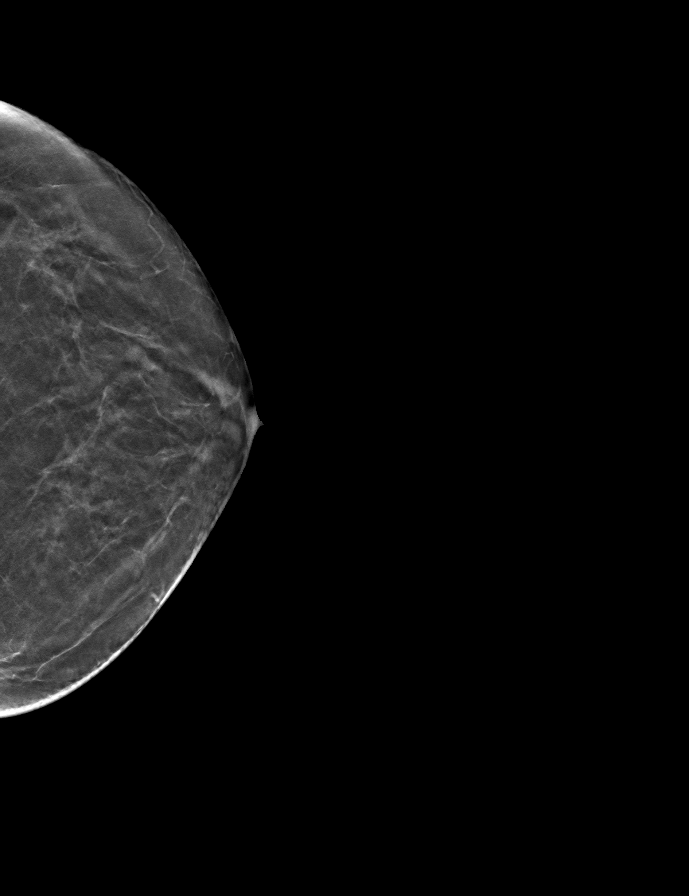

[R CC tomo · tomo slice 29/56.0]
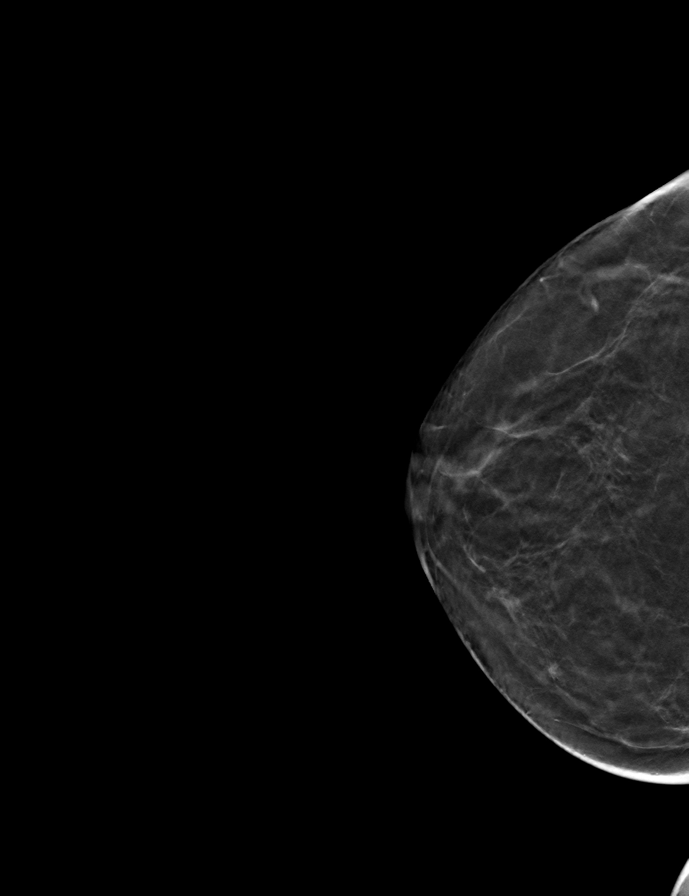

[9 of 24 positions shown; findings below may reference images not displayed]

ACR Breast Density Category b: There are scattered areas of
fibroglandular density.
FINDINGS: There are no findings suspicious for malignancy.
IMPRESSION: No mammographic evidence of malignancy. A result letter of this
screening mammogram will be mailed directly to the patient.

RECOMMENDATION:
Screening mammogram in one year. (Code:51-O-LD2)

BI-RADS CATEGORY  1: Negative.

## 2022-02-19 DIAGNOSIS — Z23 Encounter for immunization: Secondary | ICD-10-CM | POA: Diagnosis not present

## 2022-03-27 ENCOUNTER — Ambulatory Visit (INDEPENDENT_AMBULATORY_CARE_PROVIDER_SITE_OTHER): Payer: Medicare Other

## 2022-03-27 DIAGNOSIS — Z0000 Encounter for general adult medical examination without abnormal findings: Secondary | ICD-10-CM | POA: Diagnosis not present

## 2022-03-27 NOTE — Progress Notes (Signed)
Subjective:   Jean Perez is a 66 y.o. female who presents for Medicare Annual (Subsequent) preventive examination. I connected with  Jean Perez on 03/27/22 by a audio enabled telemedicine application and verified that I am speaking with the correct person using two identifiers.  Patient Location: Home  Provider Location: Office/Clinic  I discussed the limitations of evaluation and management by telemedicine. The patient expressed understanding and agreed to proceed.  Review of Systems           Objective:    There were no vitals filed for this visit. There is no height or weight on file to calculate BMI.     01/17/2021    3:02 PM 02/15/2016    6:37 PM  Advanced Directives  Does Patient Have a Medical Advance Directive? No No  Would patient like information on creating a medical advance directive?  No - patient declined information    Current Medications (verified) Outpatient Encounter Medications as of 03/27/2022  Medication Sig   albuterol (VENTOLIN HFA) 108 (90 Base) MCG/ACT inhaler Inhale 2 puffs into the lungs every 6 (six) hours as needed for wheezing or shortness of breath.   calcium-vitamin D (OSCAL WITH D) 500-200 MG-UNIT per tablet Take 1 tablet by mouth daily.   methylPREDNISolone (MEDROL DOSEPAK) 4 MG TBPK tablet Take as package instructions.   PRESCRIPTION MEDICATION Apply 1 application topically daily as needed (for pain). PT GETS A COMPOUND PRESCRIPTION AT [facility]. KETAMINE 10% LIDOCAINE 5% AMITRIPALINE 2%   Vitamin D, Ergocalciferol, (DRISDOL) 1.25 MG (50000 UNIT) CAPS capsule TAKE 1 CAPSULE BY MOUTH EVERY 7 DAYS   No facility-administered encounter medications on file as of 03/27/2022.    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    Past Surgical History:   Procedure Laterality Date   arm surgery     CARPAL TUNNEL RELEASE     left elbow     Family History  Problem Relation Age of Onset   Glaucoma Paternal Grandfather    Heart disease Father    Hypertension Mother    Breast cancer Maternal Aunt    Stroke Other    Heart disease Other    High blood pressure Other    Social History   Socioeconomic History   Marital status: Media planner    Spouse name: Not on file   Number of children: 2   Years of education: HS   Highest education level: Not on file  Occupational History    Employer: UNEMPLOYED    Comment: disabled  Tobacco Use   Smoking status: Every Day    Packs/day: 0.25    Types: Cigarettes   Smokeless tobacco: Never  Vaping Use   Vaping Use: Never used  Substance and Sexual Activity   Alcohol use: Yes    Comment: occ   Drug use: No   Sexual activity: Not Currently    Birth control/protection: Post-menopausal  Other Topics Concern   Not on file  Social History Narrative   Patient is disabled. And she is divorced.    Caffeine one pot of coffee daily.   Left handed.   Social Determinants of Health   Financial Resource Strain: Medium Risk (03/05/2021)   Overall Financial Resource Strain (CARDIA)    Difficulty of Paying  Living Expenses: Somewhat hard  Food Insecurity: No Food Insecurity (03/05/2021)   Hunger Vital Sign    Worried About Running Out of Food in the Last Year: Never true    Ran Out of Food in the Last Year: Never true  Transportation Needs: No Transportation Needs (03/05/2021)   PRAPARE - Administrator, Civil ServiceTransportation    Lack of Transportation (Medical): No    Lack of Transportation (Non-Medical): No  Physical Activity: Sufficiently Active (03/05/2021)   Exercise Vital Sign    Days of Exercise per Week: 5 days    Minutes of Exercise per Session: 30 min  Stress: Stress Concern Present (03/05/2021)   [employer] - Occupational Stress Questionnaire    Feeling of Stress : Very  much  Social Connections: Moderately Integrated (03/05/2021)   Social Connection and Isolation Panel [NHANES]    Frequency of Communication with Friends and Family: More than three times a week    Frequency of Social Gatherings with Friends and Family: Once a week    Attends Religious Services: 1 to 4 times per year    Active Member of [employer] FinancialClubs or Organizations: No    Attends BankerClub or Organization Meetings: Never    Marital Status: Living with partner  Recent Concern: Social Connections - Socially Isolated (01/17/2021)   Social Connection and Isolation Panel [NHANES]    Frequency of Communication with Friends and Family: Once a week    Frequency of Social Gatherings with Friends and Family: Never    Attends Religious Services: Never    Database administratorActive Member of Clubs or Organizations: No    Attends Engineer, structuralClub or Organization Meetings: Never    Marital Status: Living with partner    Tobacco Counseling Ready to quit: Not Answered Counseling given: Not Answered   Clinical Intake:                 Diabetic?no         Activities of Daily Living     No data to display          Patient Care Team: Anabel HalonPatel, Rutwik K, MD as PCP - General (Internal Medicine)  Indicate any recent Medical Services you may have received from other than [facility] providers in the past year (date may be approximate).     Assessment:   This is a routine wellness examination for Jean PikesSusan.  Hearing/Vision screen No results found.  Dietary issues and exercise activities discussed:     Goals Addressed   None    Depression Screen    01/16/2022    8:51 AM 12/16/2021    8:09 AM 05/27/2021   11:05 AM 03/05/2021    1:41 PM 02/11/2021    4:23 PM 01/17/2021    9:19 AM 11/13/2020    3:52 PM  PHQ 2/9 Scores  PHQ - 2 Score 0 0 0 2 0 3 0  PHQ- 9 Score 0 0  9  16 2     Fall Risk    01/16/2022    8:51 AM 12/16/2021    8:09 AM 05/27/2021   11:05 AM 02/11/2021    4:23 PM 01/17/2021    9:19 AM  Fall Risk   Falls in the past  year? 0 0 0 0 0  Number falls in past yr: 0 0 0 0 0  Injury with Fall? 0 0 0 0 0  Risk for fall due to : No Fall Risks No Fall Risks No Fall Risks No Fall Risks No Fall Risks  Follow up  Falls evaluation completed Falls evaluation completed Falls evaluation completed  Falls evaluation completed    FALL RISK PREVENTION PERTAINING TO THE HOME:  Any stairs in or around the home? Yes  If so, are there any without handrails? No  Home free of loose throw rugs in walkways, pet beds, electrical cords, etc? Yes  Adequate lighting in your home to reduce risk of falls? Yes   ASSISTIVE DEVICES UTILIZED TO PREVENT FALLS:  Life alert? No  Use of a cane, walker or w/c? No  Grab bars in the bathroom? No  Shower chair or bench in shower? No  Elevated toilet seat or a handicapped toilet? No   TIMED UP AND GO:  Was the test performed? No .  Length of time to ambulate 10 feet:  sec.     Cognitive Function:        01/17/2021    3:03 PM  6CIT Screen  What Year? 0 points  What month? 0 points  What time? 0 points  Count back from 20 0 points  Months in reverse 0 points  Repeat phrase 0 points  Total Score 0 points    Immunizations Immunization History  Administered Date(s) Administered   Influenza Inj Mdck Quad Pf 02/25/2018   Influenza,inj,Quad PF,6+ Mos 01/17/2021   Influenza-Unspecified 02/23/2013, 02/25/2018, 03/01/2019   Moderna Sars-Covid-2 Vaccination 08/25/2019, 09/22/2019    TDAP status: Due, Education has been provided regarding the importance of this vaccine. Advised may receive this vaccine at local pharmacy or Health Dept. Aware to provide a copy of the vaccination record if obtained from local pharmacy or Health Dept. Verbalized acceptance and understanding.  Flu Vaccine status: Due, Education has been provided regarding the importance of this vaccine. Advised may receive this vaccine at local pharmacy or Health Dept. Aware to provide a copy of the vaccination record if  obtained from local pharmacy or Health Dept. Verbalized acceptance and understanding.    Covid-19 vaccine status: Information provided on how to obtain vaccines.   Qualifies for Shingles Vaccine? Yes   Zostavax completed No   Shingrix Completed?: No.    Education has been provided regarding the importance of this vaccine. Patient has been advised to call insurance company to determine out of pocket expense if they have not yet received this vaccine. Advised may also receive vaccine at local pharmacy or Health Dept. Verbalized acceptance and understanding.  Screening Tests Health Maintenance  Topic Date Due   TETANUS/TDAP  Never done   COLONOSCOPY (Pts 45-63yrs Insurance coverage will need to be confirmed)  Never done   Zoster Vaccines- Shingrix (1 of 2) Never done   COVID-19 Vaccine (3 - Moderna series) 11/17/2019   INFLUENZA VACCINE  12/10/2021   Medicare Annual Wellness (AWV)  01/17/2022   MAMMOGRAM  02/12/2023   PAP SMEAR-Modifier  03/05/2024   Hepatitis C Screening  Completed   HIV Screening  Completed   HPV VACCINES  Aged Out    Health Maintenance  Health Maintenance Due  Topic Date Due   TETANUS/TDAP  Never done   COLONOSCOPY (Pts 45-69yrs Insurance coverage will need to be confirmed)  Never done   Zoster Vaccines- Shingrix (1 of 2) Never done   COVID-19 Vaccine (3 - Moderna series) 11/17/2019   INFLUENZA VACCINE  12/10/2021   Medicare Annual Wellness (AWV)  01/17/2022      Mammogram status: Completed 02/11/21. Repeat every year    Lung Cancer Screening: (Low Dose CT Chest recommended if Age 49-80 years, 30 pack-year currently  smoking OR have quit w/in 15years.) does qualify.   Lung Cancer Screening Referral:  Additional Screening:  Hepatitis C Screening: does not qualify; Completed 05/16/21  Vision Screening: Recommended annual ophthalmology exams for early detection of glaucoma and other disorders of the eye. Is the patient up to date with their annual eye  exam?  Yes  Who is the provider or what is the name of the office in which the patient attends annual eye exams? [facility] If pt is not established with a provider, would they like to be referred to a provider to establish care? No .   Dental Screening: Recommended annual dental exams for proper oral hygiene  Community Resource Referral / Chronic Care Management: CRR required this visit?  No   CCM required this visit?  No      Plan:     I have personally reviewed and noted the following in the patient's chart:   Medical and social history Use of alcohol, tobacco or illicit drugs  Current medications and supplements including opioid prescriptions. Patient is not currently taking opioid prescriptions. Functional ability and status Nutritional status Physical activity Advanced directives List of other physicians Hospitalizations, surgeries, and ER visits in previous 12 months Vitals Screenings to include cognitive, depression, and falls Referrals and appointments  In addition, I have reviewed and discussed with patient certain preventive protocols, quality metrics, and best practice recommendations. A written personalized care plan for preventive services as well as general preventive health recommendations were provided to patient.     Harriet Pho, CMA   03/27/2022   Nurse Notes:

## 2022-03-27 NOTE — Patient Instructions (Signed)
  Jean Perez , Thank you for taking time to come for your Medicare Wellness Visit. I appreciate your ongoing commitment to your health goals. Please review the following plan we discussed and let me know if I can assist you in the future.   These are the goals we discussed:  Goals      Patient Stated     Patient states that her goal is to continue to get better.     Patient Stated     Patient states that her goal is to continue to do what she needs to do to get by.         This is a list of the screening recommended for you and due dates:  Health Maintenance  Topic Date Due   Tetanus Vaccine  Never done   Colon Cancer Screening  Never done   Zoster (Shingles) Vaccine (1 of 2) Never done   COVID-19 Vaccine (3 - Moderna series) 11/17/2019   Flu Shot  12/10/2021   Mammogram  02/12/2023   Medicare Annual Wellness Visit  03/28/2023   Pap Smear  03/05/2024   Hepatitis C Screening: USPSTF Recommendation to screen - Ages 18-79 yo.  Completed   HIV Screening  Completed   HPV Vaccine  Aged Out

## 2022-05-04 ENCOUNTER — Other Ambulatory Visit: Payer: Self-pay | Admitting: Internal Medicine

## 2022-05-04 DIAGNOSIS — E559 Vitamin D deficiency, unspecified: Secondary | ICD-10-CM

## 2022-06-10 DIAGNOSIS — F3341 Major depressive disorder, recurrent, in partial remission: Secondary | ICD-10-CM | POA: Diagnosis not present

## 2022-06-10 DIAGNOSIS — G90512 Complex regional pain syndrome I of left upper limb: Secondary | ICD-10-CM | POA: Diagnosis not present

## 2022-06-10 DIAGNOSIS — E782 Mixed hyperlipidemia: Secondary | ICD-10-CM | POA: Diagnosis not present

## 2022-06-10 DIAGNOSIS — E559 Vitamin D deficiency, unspecified: Secondary | ICD-10-CM | POA: Diagnosis not present

## 2022-06-11 LAB — CBC WITH DIFFERENTIAL/PLATELET
Basophils Absolute: 0.1 10*3/uL (ref 0.0–0.2)
Basos: 0 %
EOS (ABSOLUTE): 0.1 10*3/uL (ref 0.0–0.4)
Eos: 0 %
Hematocrit: 41.7 % (ref 34.0–46.6)
Hemoglobin: 14.6 g/dL (ref 11.1–15.9)
Immature Grans (Abs): 0 10*3/uL (ref 0.0–0.1)
Immature Granulocytes: 0 %
Lymphocytes Absolute: 1.8 10*3/uL (ref 0.7–3.1)
Lymphs: 10 %
MCH: 32.7 pg (ref 26.6–33.0)
MCHC: 35 g/dL (ref 31.5–35.7)
MCV: 93 fL (ref 79–97)
Monocytes Absolute: 0.7 10*3/uL (ref 0.1–0.9)
Monocytes: 4 %
Neutrophils Absolute: 14.9 10*3/uL — ABNORMAL HIGH (ref 1.4–7.0)
Neutrophils: 86 %
Platelets: 333 10*3/uL (ref 150–450)
RBC: 4.47 x10E6/uL (ref 3.77–5.28)
RDW: 11.4 % — ABNORMAL LOW (ref 11.7–15.4)
WBC: 17.5 10*3/uL — ABNORMAL HIGH (ref 3.4–10.8)

## 2022-06-11 LAB — LIPID PANEL
Chol/HDL Ratio: 4.1 ratio (ref 0.0–4.4)
Cholesterol, Total: 230 mg/dL — ABNORMAL HIGH (ref 100–199)
HDL: 56 mg/dL (ref >39)
LDL Chol Calc (NIH): 163 mg/dL — ABNORMAL HIGH (ref 0–99)
Triglycerides: 67 mg/dL (ref 0–149)
VLDL Cholesterol Cal: 11 mg/dL (ref 5–40)

## 2022-06-11 LAB — CMP14+EGFR
ALT: 19 IU/L (ref 0–32)
AST: 18 IU/L (ref 0–40)
Albumin/Globulin Ratio: 1.9 (ref 1.2–2.2)
Albumin: 4.5 g/dL (ref 3.9–4.9)
Alkaline Phosphatase: 75 IU/L (ref 44–121)
BUN/Creatinine Ratio: 11 — ABNORMAL LOW (ref 12–28)
BUN: 11 mg/dL (ref 8–27)
Bilirubin Total: 0.3 mg/dL (ref 0.0–1.2)
CO2: 20 mmol/L (ref 20–29)
Calcium: 9.7 mg/dL (ref 8.7–10.3)
Chloride: 103 mmol/L (ref 96–106)
Creatinine, Ser: 1.01 mg/dL — ABNORMAL HIGH (ref 0.57–1.00)
Globulin, Total: 2.4 g/dL (ref 1.5–4.5)
Glucose: 94 mg/dL (ref 70–99)
Potassium: 4.8 mmol/L (ref 3.5–5.2)
Sodium: 140 mmol/L (ref 134–144)
Total Protein: 6.9 g/dL (ref 6.0–8.5)
eGFR: 62 mL/min/{1.73_m2} (ref >59)

## 2022-06-11 LAB — TSH: TSH: 3.81 u[IU]/mL (ref 0.450–4.500)

## 2022-06-11 LAB — VITAMIN D 25 HYDROXY (VIT D DEFICIENCY, FRACTURES): Vit D, 25-Hydroxy: 58.3 ng/mL (ref 30.0–100.0)

## 2022-06-18 ENCOUNTER — Encounter: Payer: Self-pay | Admitting: Internal Medicine

## 2022-06-18 ENCOUNTER — Ambulatory Visit (INDEPENDENT_AMBULATORY_CARE_PROVIDER_SITE_OTHER): Payer: Medicare Other | Admitting: Internal Medicine

## 2022-06-18 VITALS — BP 106/71 | HR 90 | Ht <= 58 in | Wt 100.6 lb

## 2022-06-18 DIAGNOSIS — E782 Mixed hyperlipidemia: Secondary | ICD-10-CM | POA: Diagnosis not present

## 2022-06-18 DIAGNOSIS — Z720 Tobacco use: Secondary | ICD-10-CM

## 2022-06-18 DIAGNOSIS — E559 Vitamin D deficiency, unspecified: Secondary | ICD-10-CM | POA: Diagnosis not present

## 2022-06-18 DIAGNOSIS — Z1211 Encounter for screening for malignant neoplasm of colon: Secondary | ICD-10-CM

## 2022-06-18 DIAGNOSIS — G90512 Complex regional pain syndrome I of left upper limb: Secondary | ICD-10-CM

## 2022-06-18 DIAGNOSIS — F17210 Nicotine dependence, cigarettes, uncomplicated: Secondary | ICD-10-CM

## 2022-06-18 MED ORDER — ROSUVASTATIN CALCIUM 5 MG PO TABS: 5.0000 mg | ORAL_TABLET | Freq: Every day | ORAL | 3 refills | Status: DC

## 2022-06-18 NOTE — Progress Notes (Signed)
Established Patient Office Visit  Subjective:  Patient ID: Jean Perez, female    DOB: May 11, 1956  Age: 67 y.o. MRN: [id number]  CC:  Chief Complaint  Patient presents with   Hyperlipidemia    Six month follow up    HPI Jean Perez is a 67 y.o. 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  She continues to have severe pain over left UE.  She prefers to not take gabapentin and does not want to take any other medicine for complex regional pain syndrome of left UE.  She has mild numbness over left UE, but strength is intact.  Her lipid profile shows persistently elevated LDL despite diet modification.   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     Past Surgical History:  Procedure Laterality Date   arm surgery     CARPAL TUNNEL RELEASE     left elbow      Family History  Problem Relation Age of Onset   Glaucoma Paternal Grandfather    Heart disease Father    Hypertension Mother    Breast cancer Maternal Aunt    Stroke Other    Heart disease Other    High blood pressure Other     Social History   Socioeconomic History   Marital status: Soil scientist    Spouse name: Not on file   Number of children: 2   Years of education: HS   Highest education level: Not on file  Occupational History    Employer: UNEMPLOYED    Comment: disabled  Tobacco Use   Smoking status: Every Day    Packs/day: 0.25    Types: Cigarettes   Smokeless tobacco: Never  Vaping Use   Vaping Use: Never used  Substance and Sexual Activity   Alcohol use: Yes    Comment: occ   Drug use: No   Sexual activity: Not Currently    Birth control/protection: Post-menopausal  Other Topics Concern   Not on file   Social History Narrative   Patient is disabled. And she is divorced.    Caffeine one pot of coffee daily.   Left handed.   Social Determinants of Health   Financial Resource Strain: High Risk (03/27/2022)   Overall Financial Resource Strain (CARDIA)    Difficulty of Paying Living Expenses: Hard  Food Insecurity: No Food Insecurity (03/27/2022)   Hunger Vital Sign    Worried About Running Out of Food in the Last Year: Never true    Ran Out of Food in the Last Year: Never true  Transportation Needs: No Transportation Needs (03/27/2022)   PRAPARE - Hydrologist (Medical): No    Lack of Transportation (Non-Medical): No  Physical Activity: Sufficiently Active (03/05/2021)   Exercise Vital Sign    Days of Exercise per Week: 5 days    Minutes of Exercise per Session: 30 min  Stress: Stress Concern Present (03/27/2022)   [place]    Feeling of Stress : Very much  Social Connections: Moderately Isolated (03/27/2022)   Social Connection and Isolation Panel [NHANES]    Frequency of Communication with Friends and Family: Twice  a week    Frequency of Social Gatherings with Friends and Family: Once a week    Attends Religious Services: Never    Marine scientist or Organizations: No    Attends Archivist Meetings: Never    Marital Status: Living with partner  Intimate Partner Violence: Not At Risk (03/05/2021)   Humiliation, Afraid, Rape, and Kick questionnaire    Fear of Current or Ex-Partner: No    Emotionally Abused: No    Physically Abused: No    Sexually Abused: No    Outpatient Medications Prior to Visit  Medication Sig Dispense Refill   albuterol (VENTOLIN HFA) 108 (90 Base) MCG/ACT inhaler Inhale 2 puffs into the lungs every 6 (six) hours as needed for wheezing or shortness of breath. 8 g 1   calcium-vitamin D (OSCAL WITH D) 500-200 MG-UNIT per tablet Take 1 tablet by  mouth daily.     Vitamin D, Ergocalciferol, (DRISDOL) 1.25 MG (50000 UNIT) CAPS capsule TAKE 1 CAPSULE BY MOUTH EVERY 7 DAYS 12 capsule 1   No facility-administered medications prior to visit.    Allergies  Allergen Reactions   Penicillins Hives, Rash and Other (See Comments)    Has patient had a PCN reaction causing immediate rash, facial/tongue/throat swelling, SOB or lightheadedness with hypotension: Yes Has patient had a PCN reaction causing severe rash involving mucus membranes or skin necrosis: Yes Has patient had a PCN reaction that required hospitalization No Has patient had a PCN reaction occurring within the last 10 years: No If all of the above answers are "NO", then may proceed with Cephalosporin use.   Other Reaction: BRONCHOSPASM    ROS Review of Systems  Constitutional:  Negative for chills and fever.  HENT:  Negative for congestion, sinus pressure, sinus pain and sore throat.   Eyes:  Negative for pain and discharge.  Respiratory:  Negative for cough and shortness of breath.   Cardiovascular:  Negative for chest pain and palpitations.  Gastrointestinal:  Negative for abdominal pain, diarrhea, nausea and vomiting.  Endocrine: Negative for polydipsia and polyuria.  Genitourinary:  Negative for dysuria and hematuria.  Musculoskeletal:  Negative for neck pain and neck stiffness.       LUE pain  Skin:  Negative for rash.  Neurological:  Negative for dizziness and weakness.  Psychiatric/Behavioral:  Negative for agitation and behavioral problems.       Objective:    Physical Exam Vitals reviewed.  Constitutional:      General: She is not in acute distress.    Appearance: She is not diaphoretic.  HENT:     Head: Normocephalic and atraumatic.     Nose: Nose normal.     Mouth/Throat:     Mouth: Mucous membranes are moist.  Eyes:     General: No scleral icterus.    Extraocular Movements: Extraocular movements intact.  Cardiovascular:     Rate and Rhythm:  Normal rate and regular rhythm.     Pulses: Normal pulses.     Heart sounds: Normal heart sounds. No murmur heard. Pulmonary:     Breath sounds: Normal breath sounds. No wheezing or rales.  Musculoskeletal:     Cervical back: Neck supple. No tenderness.     Right lower leg: No edema.     Left lower leg: No edema.  Skin:    General: Skin is warm.     Findings: No rash.  Neurological:     General: No focal deficit present.  Mental Status: She is alert and oriented to person, place, and time.     Sensory: No sensory deficit.     Motor: No weakness.  Psychiatric:        Mood and Affect: Mood normal.        Behavior: Behavior normal.     BP 106/71 (BP Location: Right Arm, Patient Position: Sitting, Cuff Size: Normal)   Pulse 90   Ht 4' 10"$  (1.473 m)   Wt 100 lb 9.6 oz (45.6 kg)   LMP  (LMP Unknown)   SpO2 96%   BMI 21.03 kg/m  Wt Readings from Last 3 Encounters:  06/18/22 100 lb 9.6 oz (45.6 kg)  01/16/22 92 lb 9.6 oz (42 kg)  12/16/21 90 lb 6.4 oz (41 kg)    Lab Results  Component Value Date   TSH 3.810 06/10/2022   Lab Results  Component Value Date   WBC 17.5 (H) 06/10/2022   HGB 14.6 06/10/2022   HCT 41.7 06/10/2022   MCV 93 06/10/2022   PLT 333 06/10/2022   Lab Results  Component Value Date   NA 140 06/10/2022   K 4.8 06/10/2022   CO2 20 06/10/2022   GLUCOSE 94 06/10/2022   BUN 11 06/10/2022   CREATININE 1.01 (H) 06/10/2022   BILITOT 0.3 06/10/2022   ALKPHOS 75 06/10/2022   AST 18 06/10/2022   ALT 19 06/10/2022   PROT 6.9 06/10/2022   ALBUMIN 4.5 06/10/2022   CALCIUM 9.7 06/10/2022   ANIONGAP 7 02/15/2016   EGFR 62 06/10/2022   Lab Results  Component Value Date   CHOL 230 (H) 06/10/2022   Lab Results  Component Value Date   HDL 56 06/10/2022   Lab Results  Component Value Date   LDLCALC 163 (H) 06/10/2022   Lab Results  Component Value Date   TRIG 67 06/10/2022   Lab Results  Component Value Date   CHOLHDL 4.1 06/10/2022   No  results found for: "HGBA1C"    Assessment & Plan:   Problem List Items Addressed This Visit       Nervous and Auditory   Complex regional pain syndrome of left upper extremity    Was well-controlled with Gabapentin, but was having dizziness, fatigue and anxiety with it, has stopped taking it Has had stellate cervical blocks in the past, does not see Neurology now Does not want to take Lyrica for now Wants to manage without medication for now      Relevant Orders   CMP14+EGFR     Other   Vitamin D deficiency    Checked Vitamin D level DC Vitamin D 50,000 IU Switch to Vitamin D 2000 IU QD      Tobacco abuse    Smokes about 5 cigarettes/day  Asked about quitting: confirms that she currently smokes cigarettes Advise to quit smoking: Educated about QUITTING to reduce the risk of cancer, cardio and cerebrovascular disease. Assess willingness: Unwilling to quit at this time, but is working on cutting back. Assist with counseling and pharmacotherapy: Counseled for 5 minutes and literature provided. Uses nicotine gums. Arrange for follow up: Follow up in 3 months and continue to offer help.      Mixed hyperlipidemia - Primary    Lipid profile reviewed Started Crestor 5 mg QD Advised to follow low cholesterol diet      Relevant Medications   rosuvastatin (CRESTOR) 5 MG tablet   Other Relevant Orders   CMP14+EGFR   Lipid Profile   Other Visit  Diagnoses     Colon cancer screening       Relevant Orders   Cologuard       Meds ordered this encounter  Medications   rosuvastatin (CRESTOR) 5 MG tablet    Sig: Take 1 tablet (5 mg total) by mouth daily.    Dispense:  90 tablet    Refill:  3    Follow-up: Return in about 6 months (around 12/17/2022) for HLD.    Lindell Spar, MD

## 2022-06-18 NOTE — Patient Instructions (Addendum)
Please start taking Crestor as prescribed.  Please stop taking Vitamin D 50,000 IU once current supply is complete and start taking Vitamin D 2000 IU once daily.  Please continue to follow low cholesterol diet and perform moderate exercise/walking at least 150 mins/week.  Please get fasting blood tests done before the next visit.

## 2022-06-18 NOTE — Assessment & Plan Note (Signed)
Was well-controlled with Gabapentin, but was having dizziness, fatigue and anxiety with it, has stopped taking it Has had stellate cervical blocks in the past, does not see Neurology now Does not want to take Lyrica for now Wants to manage without medication for now

## 2022-06-18 NOTE — Assessment & Plan Note (Addendum)
Lipid profile reviewed Started Crestor 5 mg QD Advised to follow low cholesterol diet

## 2022-06-18 NOTE — Assessment & Plan Note (Signed)
Checked Vitamin D level DC Vitamin D 50,000 IU Switch to Vitamin D 2000 IU QD

## 2022-06-20 NOTE — Assessment & Plan Note (Signed)
Smokes about 5 cigarettes/day  Asked about quitting: confirms that she currently smokes cigarettes Advise to quit smoking: Educated about QUITTING to reduce the risk of cancer, cardio and cerebrovascular disease. Assess willingness: Unwilling to quit at this time, but is working on cutting back. Assist with counseling and pharmacotherapy: Counseled for 5 minutes and literature provided. Uses nicotine gums. Arrange for follow up: Follow up in 3 months and continue to offer help.

## 2022-07-14 ENCOUNTER — Ambulatory Visit
Admission: EM | Admit: 2022-07-14 | Discharge: 2022-07-14 | Disposition: A | Discharge status: Home / Self Care | Payer: Medicare Other | Attending: Family Medicine | Admitting: Family Medicine

## 2022-07-14 DIAGNOSIS — N390 Urinary tract infection, site not specified: Secondary | ICD-10-CM | POA: Insufficient documentation

## 2022-07-14 LAB — POCT URINALYSIS DIP (MANUAL ENTRY)
Glucose, UA: 250 mg/dL — AB
Leukocytes, UA: NEGATIVE
Nitrite, UA: POSITIVE — AB
Protein Ur, POC: 30 mg/dL — AB
Spec Grav, UA: 1.015 (ref 1.010–1.025)
Urobilinogen, UA: 1 E.U./dL
pH, UA: 5 (ref 5.0–8.0)

## 2022-07-14 MED ORDER — SULFAMETHOXAZOLE-TRIMETHOPRIM 800-160 MG PO TABS: 1.0000 | ORAL_TABLET | Freq: Two times a day (BID) | ORAL | 0 refills | Status: AC

## 2022-07-14 NOTE — ED Provider Notes (Signed)
[facility]    CSN: [id number] Arrival date & time: 07/14/22  0805      History   Chief Complaint Chief Complaint  Patient presents with   Urinary Frequency    HPI Jean Perez is a 67 y.o. female.   Presenting today with 3-day history of urinary frequency, urgency, low back and suprapubic aching, dysuria, nausea.  Denies fever, chills, vomiting, bowel changes.  Taking Azo and drinking cranberry juice with mild temporary relief.    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     Patient Active Problem List   Diagnosis Date Noted   Acute bronchitis 01/16/2022   Mixed hyperlipidemia 05/27/2021   Urinary frequency 02/11/2021   Vitamin D deficiency 01/17/2021   Tobacco abuse 01/17/2021   Complex regional pain syndrome of left upper extremity 02/15/2020   Anxiety 02/15/2020   Depression 02/16/2017    Past Surgical History:  Procedure Laterality Date   arm surgery     CARPAL TUNNEL RELEASE     left elbow      OB History     Gravida  2   Para  2   Term  2   Preterm      AB      Living  2      SAB      IAB      Ectopic      Multiple      Live Births  2            Home Medications    Prior to Admission medications   Medication Sig Start Date End Date Taking? Authorizing Provider  albuterol (VENTOLIN HFA) 108 (90 Base) MCG/ACT inhaler Inhale 2 puffs into the lungs every 6 (six) hours as needed for wheezing or shortness of breath. 01/16/22  Yes Lindell Spar, MD  calcium-vitamin D (OSCAL WITH D) 500-200 MG-UNIT per tablet Take 1 tablet by mouth daily.   Yes [provider]  rosuvastatin (CRESTOR) 5 MG tablet Take 1 tablet (5 mg total) by mouth daily. 06/18/22  Yes Lindell Spar, MD  sulfamethoxazole-trimethoprim (BACTRIM DS) 800-160 MG tablet Take 1 tablet by mouth 2 (two) times  daily for 3 days. 07/14/22 07/17/22 Yes Volney American, PA-C    Family History Family History  Problem Relation Age of Onset   Glaucoma Paternal Grandfather    Heart disease Father    Hypertension Mother    Breast cancer Maternal Aunt    Stroke Other    Heart disease Other    High blood pressure Other     Social History Social History   Tobacco Use   Smoking status: Every Day    Packs/day: 0.25    Types: Cigarettes   Smokeless tobacco: Never  Vaping Use   Vaping Use: Never used  Substance Use Topics   Alcohol use: Yes    Comment: occ   Drug use: No     Allergies   Penicillins   Review of Systems Review of Systems Per HPI  Physical Exam Triage Vital Signs ED Triage Vitals  Enc Vitals Group     BP 07/14/22 0819 118/76     Pulse Rate 07/14/22 0819 (!) 103     Resp 07/14/22 0819 18     Temp 07/14/22 0819 98.5 F (  36.9 C)     Temp Source 07/14/22 0819 Oral     SpO2 07/14/22 0819 97 %     Weight --      Height --      Head Circumference --      Peak Flow --      Pain Score 07/14/22 0820 5     Pain Loc --      Pain Edu? --      Excl. in [place]? --    No data found.  Updated Vital Signs BP 118/76 (BP Location: Right Arm)   Pulse (!) 103   Temp 98.5 F (36.9 C) (Oral)   Resp 18   LMP  (LMP Unknown)   SpO2 97%   Visual Acuity Right Eye Distance:   Left Eye Distance:   Bilateral Distance:    Right Eye Near:   Left Eye Near:    Bilateral Near:     Physical Exam Vitals and nursing note reviewed.  Constitutional:      Appearance: Normal appearance. She is not ill-appearing.  HENT:     Head: Atraumatic.     Mouth/Throat:     Mouth: Mucous membranes are moist.  Eyes:     Extraocular Movements: Extraocular movements intact.     Conjunctiva/sclera: Conjunctivae normal.  Cardiovascular:     Rate and Rhythm: Normal rate and regular rhythm.     Heart sounds: Normal heart sounds.  Pulmonary:     Effort: Pulmonary effort is normal.     Breath  sounds: Normal breath sounds.  Abdominal:     General: Bowel sounds are normal. There is no distension.     Palpations: Abdomen is soft.     Tenderness: There is no abdominal tenderness. There is no right CVA tenderness, left CVA tenderness or guarding.  Musculoskeletal:        General: Normal range of motion.     Cervical back: Normal range of motion and neck supple.  Skin:    General: Skin is warm and dry.  Neurological:     Mental Status: She is alert and oriented to person, place, and time.  Psychiatric:        Mood and Affect: Mood normal.        Thought Content: Thought content normal.        Judgment: Judgment normal.    UC Treatments / Results  Labs (all labs ordered are listed, but only abnormal results are displayed) Labs Reviewed  POCT URINALYSIS DIP (MANUAL ENTRY) - Abnormal; Notable for the following components:      Result Value   Color, UA orange (*)    Glucose, UA =250 (*)    Bilirubin, UA small (*)    Ketones, POC UA trace (5) (*)    Blood, UA trace-intact (*)    Protein Ur, POC =30 (*)    Nitrite, UA Positive (*)    All other components within normal limits  URINE CULTURE   EKG  Radiology No results found.  Procedures Procedures (including critical care time)  Medications Ordered in UC Medications - No data to display  Initial Impression / Assessment and Plan / UC Course  I have reviewed the triage vital signs and the nursing notes.  Pertinent labs & imaging results that were available during my care of the patient were reviewed by me and considered in my medical decision making (see chart for details).     Urinalysis today with chance of urinary tract infection.  Treat  with Bactrim while awaiting urine culture and adjust if needed.  Discussed Azo, fluids, supportive home care.  Return for worsening symptoms.  Final Clinical Impressions(s) / UC Diagnoses   Final diagnoses:  Acute lower UTI     Discharge Instructions      I have sent  over a course of antibiotics to the pharmacy for you to start right away.  You may continue taking Azo as needed, drinking plenty of water and you may continue cranberry pills or juice if desired.  We will call you if your urine culture tells [place] that we need to change anything about your medication.    ED Prescriptions     Medication Sig Dispense Auth. Provider   sulfamethoxazole-trimethoprim (BACTRIM DS) 800-160 MG tablet Take 1 tablet by mouth 2 (two) times daily for 3 days. 6 tablet Volney American, [place]      PDMP not reviewed this encounter.   Volney American, [place] 07/14/22 1424

## 2022-07-14 NOTE — ED Triage Notes (Signed)
Lower abdominal cramping, urinary frequency, urinary urgency, lower back pain, burning pain all the time with nausea.Taking Azo and cranberry juice. That started Friday.

## 2022-07-14 NOTE — Discharge Instructions (Signed)
I have sent over a course of antibiotics to the pharmacy for you to start right away.  You may continue taking Azo as needed, drinking plenty of water and you may continue cranberry pills or juice if desired.  We will call you if your urine culture tells [place] that we need to change anything about your medication.

## 2022-07-15 LAB — URINE CULTURE: Culture: NO GROWTH

## 2022-07-18 ENCOUNTER — Ambulatory Visit (INDEPENDENT_AMBULATORY_CARE_PROVIDER_SITE_OTHER): Payer: Medicare Other | Admitting: Internal Medicine

## 2022-07-18 ENCOUNTER — Encounter: Payer: Self-pay | Admitting: Internal Medicine

## 2022-07-18 VITALS — BP 105/66 | HR 96 | Ht <= 58 in | Wt 98.4 lb

## 2022-07-18 DIAGNOSIS — R350 Frequency of micturition: Secondary | ICD-10-CM

## 2022-07-18 DIAGNOSIS — R109 Unspecified abdominal pain: Secondary | ICD-10-CM

## 2022-07-18 DIAGNOSIS — R110 Nausea: Secondary | ICD-10-CM | POA: Diagnosis not present

## 2022-07-18 DIAGNOSIS — N3000 Acute cystitis without hematuria: Secondary | ICD-10-CM

## 2022-07-18 MED ORDER — ONDANSETRON HCL 4 MG PO TABS: 4.0000 mg | ORAL_TABLET | Freq: Three times a day (TID) | ORAL | 0 refills | Status: DC | PRN

## 2022-07-18 NOTE — Assessment & Plan Note (Signed)
Had urinary frequency, dysuria and foul-smelling urine Completed Bactrim for UTI, although urine culture was negative later If persistent, may need Urology evaluation

## 2022-07-18 NOTE — Progress Notes (Signed)
Established Patient Office Visit  Subjective:  Patient ID: Jean Perez, female    DOB: 02/22/1956  Age: 67 y.o. MRN: [id number]  CC:  Chief Complaint  Patient presents with   Urinary Tract Infection    Patient went to urgent care on Monday for a uti , they sent her home on an antibiotic, she states she is still having issues and not getting any better. Currently she is feeling weak , dizzy, lower back pain, pressure in abdominal, frequent urination.    HPI Jean Perez is a 67 y.o. female with past medical history of complex regional pain syndrome of left UE, depression with anxiety and tobacco abuse who presents for f/u of recent urgent care visit for UTI.  She was having urinary frequency, dysuria, foul-smelling urine and right sided flank pain since 03/01.  She went to urgent care, had UA done, and was given Bactrim for UTI.  Her urine culture was negative for UTI.  She has also tried Azo with some relief.  Flank pain is constant, sharp, worse with urination, but states that it has improved compared to prior.  She has had episode of UTI in the past as well.  She also reports nausea and loose BM.  Denies any fever or chills.    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     Past Surgical History:  Procedure Laterality Date   arm surgery     CARPAL TUNNEL RELEASE     left elbow      Family History  Problem Relation Age of Onset   Glaucoma Paternal Grandfather    Heart disease Father    Hypertension Mother    Breast cancer Maternal Aunt    Stroke Other    Heart disease Other    High blood pressure Other     Social History   Socioeconomic History   Marital status: Soil scientist    Spouse name: Not on file   Number of children: 2   Years of education: HS   Highest education level: Not on file  Occupational History     Employer: UNEMPLOYED    Comment: disabled  Tobacco Use   Smoking status: Every Day    Packs/day: 0.25    Types: Cigarettes   Smokeless tobacco: Never  Vaping Use   Vaping Use: Never used  Substance and Sexual Activity   Alcohol use: Yes    Comment: occ   Drug use: No   Sexual activity: Not Currently    Birth control/protection: Post-menopausal  Other Topics Concern   Not on file  Social History Narrative   Patient is disabled. And she is divorced.    Caffeine one pot of coffee daily.   Left handed.   Social Determinants of Health   Financial Resource Strain: High Risk (03/27/2022)   Overall Financial Resource Strain (CARDIA)    Difficulty of Paying Living Expenses: Hard  Food Insecurity: No Food Insecurity (03/27/2022)   Hunger Vital Sign    Worried About Running Out of Food in the Last Year: Never true    Ran Out of Food in the Last Year: Never true  Transportation Needs: No Transportation Needs (03/27/2022)   PRAPARE - Hydrologist (Medical): No    Lack of Transportation (Non-Medical): No  Physical Activity: Sufficiently Active (03/05/2021)   Exercise Vital Sign    Days of Exercise per Week: 5 days    Minutes of Exercise per Session: 30 min  Stress: Stress Concern Present (03/27/2022)   [place]    Feeling of Stress : Very much  Social Connections: Moderately Isolated (03/27/2022)   Social Connection and Isolation Panel [NHANES]    Frequency of Communication with Friends and Family: Twice a week    Frequency of Social Gatherings with Friends and Family: Once a week    Attends Religious Services: Never    Marine scientist or Organizations: No    Attends Archivist Meetings: Never    Marital Status: Living with partner  Intimate Partner Violence: Not At Risk (03/05/2021)   Humiliation, Afraid, Rape, and Kick questionnaire    Fear of Current or Ex-Partner:  No    Emotionally Abused: No    Physically Abused: No    Sexually Abused: No    Outpatient Medications Prior to Visit  Medication Sig Dispense Refill   albuterol (VENTOLIN HFA) 108 (90 Base) MCG/ACT inhaler Inhale 2 puffs into the lungs every 6 (six) hours as needed for wheezing or shortness of breath. 8 g 1   calcium-vitamin D (OSCAL WITH D) 500-200 MG-UNIT per tablet Take 1 tablet by mouth daily.     rosuvastatin (CRESTOR) 5 MG tablet Take 1 tablet (5 mg total) by mouth daily. 90 tablet 3   No facility-administered medications prior to visit.    Allergies  Allergen Reactions   Penicillins Hives, Rash and Other (See Comments)    Has patient had a PCN reaction causing immediate rash, facial/tongue/throat swelling, SOB or lightheadedness with hypotension: Yes Has patient had a PCN reaction causing severe rash involving mucus membranes or skin necrosis: Yes Has patient had a PCN reaction that required hospitalization No Has patient had a PCN reaction occurring within the last 10 years: No If all of the above answers are "NO", then may proceed with Cephalosporin use.   Other Reaction: BRONCHOSPASM    ROS Review of Systems  Constitutional:  Negative for chills and fever.  HENT:  Negative for congestion, sinus pressure, sinus pain and sore throat.   Eyes:  Negative for pain and discharge.  Respiratory:  Negative for cough and shortness of breath.   Cardiovascular:  Negative for chest pain and palpitations.  Gastrointestinal:  Positive for nausea. Negative for abdominal pain, diarrhea and vomiting.  Endocrine: Negative for polydipsia and polyuria.  Genitourinary:  Positive for dysuria and flank pain. Negative for hematuria.  Musculoskeletal:  Negative for neck pain and neck stiffness.       LUE pain  Skin:  Negative for rash.  Neurological:  Negative for weakness and headaches.  Psychiatric/Behavioral:  Negative for agitation and behavioral problems.       Objective:     Physical Exam Vitals reviewed.  Constitutional:      General: She is not in acute distress.    Appearance: She is not diaphoretic.  HENT:     Head: Normocephalic and atraumatic.     Nose: Nose normal.     Mouth/Throat:     Mouth: Mucous membranes are moist.  Eyes:     General: No scleral icterus.    Extraocular Movements: Extraocular movements intact.  Cardiovascular:     Rate and Rhythm: Normal rate and regular rhythm.     Pulses: Normal pulses.  Heart sounds: Normal heart sounds. No murmur heard. Pulmonary:     Breath sounds: Normal breath sounds. No wheezing or rales.  Abdominal:     Palpations: Abdomen is soft.     Tenderness: There is abdominal tenderness (Mild, RLQ). There is right CVA tenderness. There is no guarding or rebound.  Musculoskeletal:     Cervical back: Neck supple. No tenderness.     Right lower leg: No edema.     Left lower leg: No edema.  Skin:    General: Skin is warm.     Findings: No rash.  Neurological:     General: No focal deficit present.     Mental Status: She is alert and oriented to person, place, and time.  Psychiatric:        Mood and Affect: Mood normal.        Behavior: Behavior normal.     BP 105/66 (BP Location: Right Arm, Patient Position: Sitting, Cuff Size: Normal)   Pulse 96   Ht '4\' 10"'$  (1.473 m)   Wt 98 lb 6.4 oz (44.6 kg)   LMP  (LMP Unknown)   SpO2 96%   BMI 20.57 kg/m  Wt Readings from Last 3 Encounters:  07/18/22 98 lb 6.4 oz (44.6 kg)  06/18/22 100 lb 9.6 oz (45.6 kg)  01/16/22 92 lb 9.6 oz (42 kg)    Lab Results  Component Value Date   TSH 3.810 06/10/2022   Lab Results  Component Value Date   WBC 17.5 (H) 06/10/2022   HGB 14.6 06/10/2022   HCT 41.7 06/10/2022   MCV 93 06/10/2022   PLT 333 06/10/2022   Lab Results  Component Value Date   NA 140 06/10/2022   K 4.8 06/10/2022   CO2 20 06/10/2022   GLUCOSE 94 06/10/2022   BUN 11 06/10/2022   CREATININE 1.01 (H) 06/10/2022   BILITOT 0.3  06/10/2022   ALKPHOS 75 06/10/2022   AST 18 06/10/2022   ALT 19 06/10/2022   PROT 6.9 06/10/2022   ALBUMIN 4.5 06/10/2022   CALCIUM 9.7 06/10/2022   ANIONGAP 7 02/15/2016   EGFR 62 06/10/2022   Lab Results  Component Value Date   CHOL 230 (H) 06/10/2022   Lab Results  Component Value Date   HDL 56 06/10/2022   Lab Results  Component Value Date   LDLCALC 163 (H) 06/10/2022   Lab Results  Component Value Date   TRIG 67 06/10/2022   Lab Results  Component Value Date   CHOLHDL 4.1 06/10/2022   No results found for: "HGBA1C"    Assessment & Plan:   Problem List Items Addressed This Visit       Other   Urinary frequency    Had urinary frequency, dysuria and foul-smelling urine Completed Bactrim for UTI, although urine culture was negative later If persistent, may need Urology evaluation      Right flank pain - Primary    Right flank pain with CVA tenderness Urine culture negative, less likely pyelonephritis Check CT abdomen to rule out renal stone Colitis may also give RLQ pain with loose BM Advised to maintain adequate hydration for now      Relevant Orders   CT Abdomen Pelvis Wo Contrast   Other Visit Diagnoses     Acute cystitis without hematuria       Relevant Orders   CT Abdomen Pelvis Wo Contrast   Nausea       Relevant Medications   ondansetron (ZOFRAN) 4 MG tablet  Meds ordered this encounter  Medications   ondansetron (ZOFRAN) 4 MG tablet    Sig: Take 1 tablet (4 mg total) by mouth every 8 (eight) hours as needed for nausea or vomiting.    Dispense:  20 tablet    Refill:  0    Follow-up: Return if symptoms worsen or fail to improve.    Lindell Spar, MD

## 2022-07-18 NOTE — Patient Instructions (Signed)
Please take Azo as needed for urinary frequency and burning.  Please maintain at least 64 ounces of fluid in a day.  You are being scheduled to get CT abdomen pelvis.

## 2022-07-18 NOTE — Assessment & Plan Note (Signed)
Right flank pain with CVA tenderness Urine culture negative, less likely pyelonephritis Check CT abdomen to rule out renal stone Colitis may also give RLQ pain with loose BM Advised to maintain adequate hydration for now

## 2022-07-31 ENCOUNTER — Ambulatory Visit (HOSPITAL_BASED_OUTPATIENT_CLINIC_OR_DEPARTMENT_OTHER)
Admission: RE | Admit: 2022-07-31 | Discharge: 2022-07-31 | Disposition: A | Discharge status: Home / Self Care | Payer: Medicare Other | Source: Ambulatory Visit | Attending: Internal Medicine | Admitting: Internal Medicine

## 2022-07-31 DIAGNOSIS — N3000 Acute cystitis without hematuria: Secondary | ICD-10-CM | POA: Diagnosis not present

## 2022-07-31 DIAGNOSIS — R109 Unspecified abdominal pain: Secondary | ICD-10-CM | POA: Diagnosis not present

## 2022-07-31 DIAGNOSIS — I700 Atherosclerosis of aorta: Secondary | ICD-10-CM | POA: Diagnosis not present

## 2022-11-11 ENCOUNTER — Other Ambulatory Visit: Payer: Self-pay | Admitting: Internal Medicine

## 2022-11-11 DIAGNOSIS — E559 Vitamin D deficiency, unspecified: Secondary | ICD-10-CM

## 2022-11-25 DIAGNOSIS — Z1211 Encounter for screening for malignant neoplasm of colon: Secondary | ICD-10-CM | POA: Diagnosis not present

## 2022-12-02 LAB — COLOGUARD
COLOGUARD: NEGATIVE
Cologuard: NEGATIVE

## 2022-12-08 DIAGNOSIS — E782 Mixed hyperlipidemia: Secondary | ICD-10-CM | POA: Diagnosis not present

## 2022-12-08 DIAGNOSIS — G90512 Complex regional pain syndrome I of left upper limb: Secondary | ICD-10-CM | POA: Diagnosis not present

## 2022-12-17 ENCOUNTER — Encounter: Payer: Self-pay | Admitting: Internal Medicine

## 2022-12-17 ENCOUNTER — Ambulatory Visit (INDEPENDENT_AMBULATORY_CARE_PROVIDER_SITE_OTHER): Payer: Medicare Other | Admitting: Internal Medicine

## 2022-12-17 VITALS — BP 111/76 | HR 93 | Ht <= 58 in | Wt 96.6 lb

## 2022-12-17 DIAGNOSIS — F3341 Major depressive disorder, recurrent, in partial remission: Secondary | ICD-10-CM

## 2022-12-17 DIAGNOSIS — G90512 Complex regional pain syndrome I of left upper limb: Secondary | ICD-10-CM

## 2022-12-17 DIAGNOSIS — F419 Anxiety disorder, unspecified: Secondary | ICD-10-CM | POA: Diagnosis not present

## 2022-12-17 DIAGNOSIS — E782 Mixed hyperlipidemia: Secondary | ICD-10-CM

## 2022-12-17 MED ORDER — DULOXETINE HCL 20 MG PO CPEP: 20.0000 mg | ORAL_CAPSULE | Freq: Every day | ORAL | 3 refills | Status: DC

## 2022-12-17 NOTE — Assessment & Plan Note (Signed)
Lipid profile reviewed - LDL improved compared to prior On Crestor 5 mg QD Advised to increase dose of Crestor, but she prefers to work on her diet and continue Crestor 5 mg QD Advised to follow low cholesterol diet

## 2022-12-17 NOTE — Assessment & Plan Note (Deleted)
Smokes about 5 cigarettes/day  Asked about quitting: confirms that she currently smokes cigarettes Advise to quit smoking: Educated about QUITTING to reduce the risk of cancer, cardio and cerebrovascular disease. Assess willingness: Unwilling to quit at this time, but is working on cutting back. Assist with counseling and pharmacotherapy: Counseled for 5 minutes and literature provided. Uses nicotine gums. Arrange for follow up: Follow up in 3 months and continue to offer help.

## 2022-12-17 NOTE — Assessment & Plan Note (Signed)
Was on Lexapro Has chronic anxiety Started Cymbalta for neuropathic pain, can also help with GAD

## 2022-12-17 NOTE — Progress Notes (Signed)
Established Patient Office Visit  Subjective:  Patient ID: Jean Perez, female    DOB: 1955-12-25  Age: 67 y.o. MRN: [id number]  CC:  Chief Complaint  Patient presents with   Hyperlipidemia    Six month follow up     HPI PLESHETTE Perez is a 67 y.o. 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  She continues to have severe pain over left UE.  She prefers to not take gabapentin for complex regional pain syndrome of left UE.  She has mild numbness over left UE, but strength is intact.  Her lipid profile shows persistently elevated LDL, but better than prior with Crestor and diet modification.   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     Past Surgical History:  Procedure Laterality Date   arm surgery     CARPAL TUNNEL RELEASE     left elbow      Family History  Problem Relation Age of Onset   Glaucoma Paternal Grandfather    Heart disease Father    Hypertension Mother    Breast cancer Maternal Aunt    Stroke Other    Heart disease Other    High blood pressure Other     Social History   Socioeconomic History   Marital status: Media planner    Spouse name: Not on file   Number of children: 2   Years of education: HS   Highest education level: Not on file  Occupational History    Employer: UNEMPLOYED    Comment: disabled  Tobacco Use   Smoking status: Every Day    Current packs/day: 0.25    Types: Cigarettes   Smokeless tobacco: Never  Vaping Use   Vaping status: Never Used  Substance and Sexual Activity   Alcohol use: Yes    Comment: occ   Drug use: No   Sexual activity: Not Currently    Birth control/protection: Post-menopausal  Other Topics Concern   Not on file  Social  History Narrative   Patient is disabled. And she is divorced.    Caffeine one pot of coffee daily.   Left handed.   Social Determinants of Health   Financial Resource Strain: High Risk (03/27/2022)   Overall Financial Resource Strain (CARDIA)    Difficulty of Paying Living Expenses: Hard  Food Insecurity: No Food Insecurity (03/27/2022)   Hunger Vital Sign    Worried About Running Out of Food in the Last Year: Never true    Ran Out of Food in the Last Year: Never true  Transportation Needs: No Transportation Needs (03/27/2022)   PRAPARE - Administrator, Civil Service (Medical): No    Lack of Transportation (Non-Medical): No  Physical Activity: Sufficiently Active (03/05/2021)   Exercise Vital Sign    Days of Exercise per Week: 5 days    Minutes of Exercise per Session: 30 min  Stress: Stress Concern Present (03/27/2022)   [employer] of Occupational Health - Occupational Stress Questionnaire    Feeling of Stress : Very much  Social Connections: Moderately Isolated (03/27/2022)   Social Connection and Isolation Panel [NHANES]    Frequency of Communication with Friends and Family: Twice a  week    Frequency of Social Gatherings with Friends and Family: Once a week    Attends Religious Services: Never    Database administrator or Organizations: No    Attends Banker Meetings: Never    Marital Status: Living with partner  Intimate Partner Violence: Not At Risk (03/05/2021)   Humiliation, Afraid, Rape, and Kick questionnaire    Fear of Current or Ex-Partner: No    Emotionally Abused: No    Physically Abused: No    Sexually Abused: No    Outpatient Medications Prior to Visit  Medication Sig Dispense Refill   albuterol (VENTOLIN HFA) 108 (90 Base) MCG/ACT inhaler Inhale 2 puffs into the lungs every 6 (six) hours as needed for wheezing or shortness of breath. 8 g 1   calcium-vitamin D (OSCAL WITH D) 500-200 MG-UNIT per tablet Take 1 tablet by mouth  daily.     ondansetron (ZOFRAN) 4 MG tablet Take 1 tablet (4 mg total) by mouth every 8 (eight) hours as needed for nausea or vomiting. 20 tablet 0   rosuvastatin (CRESTOR) 5 MG tablet Take 1 tablet (5 mg total) by mouth daily. 90 tablet 3   No facility-administered medications prior to visit.    Allergies  Allergen Reactions   Penicillins Hives, Rash and Other (See Comments)    Has patient had a PCN reaction causing immediate rash, facial/tongue/throat swelling, SOB or lightheadedness with hypotension: Yes Has patient had a PCN reaction causing severe rash involving mucus membranes or skin necrosis: Yes Has patient had a PCN reaction that required hospitalization No Has patient had a PCN reaction occurring within the last 10 years: No If all of the above answers are "NO", then may proceed with Cephalosporin use.   Other Reaction: BRONCHOSPASM    ROS Review of Systems  Constitutional:  Negative for chills and fever.  HENT:  Negative for congestion, sinus pressure, sinus pain and sore throat.   Eyes:  Negative for pain and discharge.  Respiratory:  Negative for cough and shortness of breath.   Cardiovascular:  Negative for chest pain and palpitations.  Gastrointestinal:  Negative for abdominal pain, diarrhea, nausea and vomiting.  Endocrine: Negative for polydipsia and polyuria.  Genitourinary:  Negative for dysuria and hematuria.  Musculoskeletal:  Negative for neck pain and neck stiffness.       LUE pain  Skin:  Negative for rash.  Neurological:  Negative for dizziness and weakness.  Psychiatric/Behavioral:  Negative for agitation and behavioral problems.       Objective:    Physical Exam Vitals reviewed.  Constitutional:      General: She is not in acute distress.    Appearance: She is not diaphoretic.  HENT:     Head: Normocephalic and atraumatic.     Nose: Nose normal.     Mouth/Throat:     Mouth: Mucous membranes are moist.  Eyes:     General: No scleral  icterus.    Extraocular Movements: Extraocular movements intact.  Cardiovascular:     Rate and Rhythm: Normal rate and regular rhythm.     Pulses: Normal pulses.     Heart sounds: Normal heart sounds. No murmur heard. Pulmonary:     Breath sounds: Normal breath sounds. No wheezing or rales.  Musculoskeletal:     Cervical back: Neck supple. No tenderness.     Right lower leg: No edema.     Left lower leg: No edema.  Skin:    General: Skin is  warm.     Findings: No rash.  Neurological:     General: No focal deficit present.     Mental Status: She is alert and oriented to person, place, and time.     Sensory: No sensory deficit.     Motor: No weakness.  Psychiatric:        Mood and Affect: Mood normal.        Behavior: Behavior normal.     BP 111/76 (BP Location: Left Arm, Patient Position: Sitting, Cuff Size: Normal)   Pulse 93   Ht 4\' 10"  (1.473 m)   Wt 96 lb 9.6 oz (43.8 kg)   LMP  (LMP Unknown)   SpO2 97%   BMI 20.19 kg/m  Wt Readings from Last 3 Encounters:  12/17/22 96 lb 9.6 oz (43.8 kg)  07/18/22 98 lb 6.4 oz (44.6 kg)  06/18/22 100 lb 9.6 oz (45.6 kg)    Lab Results  Component Value Date   TSH 3.810 06/10/2022   Lab Results  Component Value Date   WBC 17.5 (H) 06/10/2022   HGB 14.6 06/10/2022   HCT 41.7 06/10/2022   MCV 93 06/10/2022   PLT 333 06/10/2022   Lab Results  Component Value Date   NA 138 12/08/2022   K 4.8 12/08/2022   CO2 21 12/08/2022   GLUCOSE 106 (H) 12/08/2022   BUN 15 12/08/2022   CREATININE 1.11 (H) 12/08/2022   BILITOT 0.2 12/08/2022   ALKPHOS 89 12/08/2022   AST 20 12/08/2022   ALT 20 12/08/2022   PROT 7.6 12/08/2022   ALBUMIN 4.5 12/08/2022   CALCIUM 9.7 12/08/2022   ANIONGAP 7 02/15/2016   EGFR 56 (L) 12/08/2022   Lab Results  Component Value Date   CHOL 216 (H) 12/08/2022   Lab Results  Component Value Date   HDL 72 12/08/2022   Lab Results  Component Value Date   LDLCALC 134 (H) 12/08/2022   Lab Results   Component Value Date   TRIG 55 12/08/2022   Lab Results  Component Value Date   CHOLHDL 3.0 12/08/2022   No results found for: "HGBA1C"    Assessment & Plan:   Problem List Items Addressed This Visit       Nervous and Auditory   Complex regional pain syndrome of left upper extremity - Primary    Was well-controlled with Gabapentin, but was having dizziness, fatigue and anxiety with it, has stopped taking it Has had stellate cervical blocks in the past, does not see Neurology now Started Cymbalta 20 mg daily, plan to increase the dose as tolerated -Cymbalta can help with MDD and anxiety as well      Relevant Medications   DULoxetine (CYMBALTA) 20 MG capsule     Other   Depression    Flowsheet Row Office Visit from 12/17/2022 in [facility]  PHQ-2 Total Score 0      Well controlled currently Was on SSRI in the past Started Cymbalta for neuropathic pain      Relevant Medications   DULoxetine (CYMBALTA) 20 MG capsule   Anxiety    Was on Lexapro Has chronic anxiety Started Cymbalta for neuropathic pain, can also help with GAD      Relevant Medications   DULoxetine (CYMBALTA) 20 MG capsule   Mixed hyperlipidemia    Lipid profile reviewed - LDL improved compared to prior On Crestor 5 mg QD Advised to increase dose of Crestor, but she prefers to work on her diet  and continue Crestor 5 mg QD Advised to follow low cholesterol diet      Relevant Orders   CMP14+EGFR   Lipid Profile     Meds ordered this encounter  Medications   DULoxetine (CYMBALTA) 20 MG capsule    Sig: Take 1 capsule (20 mg total) by mouth daily.    Dispense:  30 capsule    Refill:  3    Follow-up: Return in about 4 months (around 04/18/2023) for HLD and chronic pain.    Anabel Halon, MD

## 2022-12-17 NOTE — Assessment & Plan Note (Addendum)
Was well-controlled with Gabapentin, but was having dizziness, fatigue and anxiety with it, has stopped taking it Has had stellate cervical blocks in the past, does not see Neurology now Started Cymbalta 20 mg daily, plan to increase the dose as tolerated -Cymbalta can help with MDD and anxiety as well

## 2022-12-17 NOTE — Assessment & Plan Note (Signed)
Flowsheet Row Office Visit from 12/17/2022 in [facility]  PHQ-2 Total Score 0      Well controlled currently Was on SSRI in the past Started Cymbalta for neuropathic pain

## 2022-12-17 NOTE — Patient Instructions (Signed)
Please start taking Duloxetine as prescribed.  Please continue taking Crestor.  Please continue to follow low cholesterol diet and perform moderate exercise/walking at least 150 mins/week.

## 2023-02-18 DIAGNOSIS — Z23 Encounter for immunization: Secondary | ICD-10-CM | POA: Diagnosis not present

## 2023-04-15 ENCOUNTER — Ambulatory Visit: Payer: Medicare Other

## 2023-04-15 VITALS — Ht <= 58 in | Wt 96.0 lb

## 2023-04-15 DIAGNOSIS — Z0000 Encounter for general adult medical examination without abnormal findings: Secondary | ICD-10-CM | POA: Diagnosis not present

## 2023-04-15 DIAGNOSIS — Z780 Asymptomatic menopausal state: Secondary | ICD-10-CM

## 2023-04-15 DIAGNOSIS — Z1231 Encounter for screening mammogram for malignant neoplasm of breast: Secondary | ICD-10-CM

## 2023-04-15 NOTE — Progress Notes (Signed)
Subjective:   Jean Perez is a 67 y.o. female who presents for Medicare Annual (Subsequent) preventive examination.  Visit Complete: Virtual I connected with  [name] on 04/15/23 by a audio enabled telemedicine application and verified that I am speaking with the correct person using two identifiers.  Patient Location: Home  Provider Location: Home Office  I discussed the limitations of evaluation and management by telemedicine. The patient expressed understanding and agreed to proceed.  Vital Signs: Because this visit was a virtual/telehealth visit, some criteria may be missing or patient reported. Any vitals not documented were not able to be obtained and vitals that have been documented are patient reported.  Patient Medicare AWV questionnaire was completed by the patient on 04/11/23; I have confirmed that all information answered by patient is correct and no changes since this date.  Cardiac Risk Factors include: dyslipidemia     Objective:    Today's Vitals   04/15/23 1240  Weight: 96 lb (43.5 kg)  Height: 4\' 10"  (1.473 m)   Body mass index is 20.06 kg/m.     04/15/2023   12:49 PM 03/27/2022    9:37 AM 01/17/2021    3:02 PM 02/15/2016    6:37 PM  Advanced Directives  Does Patient Have a Medical Advance Directive? No No No No  Would patient like information on creating a medical advance directive? Yes (MAU/Ambulatory/Procedural Areas - Information given) No - Patient declined  No - patient declined information    Current Medications (verified) Outpatient Encounter Medications as of 04/15/2023  Medication Sig   albuterol (VENTOLIN HFA) 108 (90 Base) MCG/ACT inhaler Inhale 2 puffs into the lungs every 6 (six) hours as needed for wheezing or shortness of breath.   calcium-vitamin D (OSCAL WITH D) 500-200 MG-UNIT per tablet Take 1 tablet by mouth daily.   DULoxetine (CYMBALTA) 20 MG capsule Take 1 capsule (20 mg total) by mouth daily.   ondansetron (ZOFRAN) 4 MG  tablet Take 1 tablet (4 mg total) by mouth every 8 (eight) hours as needed for nausea or vomiting.   rosuvastatin (CRESTOR) 5 MG tablet Take 1 tablet (5 mg total) by mouth daily.   No facility-administered encounter medications on file as of 04/15/2023.    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    Past Surgical History:  Procedure Laterality Date   arm surgery     CARPAL TUNNEL RELEASE     left elbow     Family History  Problem Relation Age of Onset   Glaucoma Paternal Grandfather    Heart disease Father    Hypertension Mother    Breast cancer Maternal Aunt    Stroke Other    Heart disease Other    High blood pressure Other    Social History   Socioeconomic History   Marital status: Media planner    Spouse name: Not on file   Number of children: 2   Years of education: HS   Highest education level: Not on file  Occupational History    Employer: UNEMPLOYED    Comment: disabled  Tobacco Use   Smoking status: Every Day    Current packs/day: 0.25    Types: Cigarettes   Smokeless tobacco: Never  Vaping Use   Vaping status: Never  Used  Substance and Sexual Activity   Alcohol use: Yes    Comment: occ   Drug use: No   Sexual activity: Not Currently    Birth control/protection: Post-menopausal  Other Topics Concern   Not on file  Social History Narrative   Patient is disabled. And she is divorced.    Caffeine one pot of coffee daily.   Left handed.   Social Determinants of Health   Financial Resource Strain: Low Risk  (04/11/2023)   Overall Financial Resource Strain (CARDIA)    Difficulty of Paying Living Expenses: Not very hard  Food Insecurity: No Food Insecurity (04/11/2023)   Hunger Vital Sign    Worried About Running Out of Food in the Last Year: Never true     Ran Out of Food in the Last Year: Never true  Transportation Needs: No Transportation Needs (04/11/2023)   PRAPARE - Administrator, Civil Service (Medical): No    Lack of Transportation (Non-Medical): No  Physical Activity: Sufficiently Active (04/11/2023)   Exercise Vital Sign    Days of Exercise per Week: 5 days    Minutes of Exercise per Session: 60 min  Stress: Stress Concern Present (04/11/2023)   [employer] of Occupational Health - Occupational Stress Questionnaire    Feeling of Stress : Very much  Social Connections: Moderately Isolated (04/11/2023)   Social Connection and Isolation Panel [NHANES]    Frequency of Communication with Friends and Family: Three times a week    Frequency of Social Gatherings with Friends and Family: Once a week    Attends Religious Services: Never    Database administrator or Organizations: No    Attends Engineer, structural: Never    Marital Status: Living with partner    Tobacco Counseling Ready to quit: Not Answered Counseling given: Not Answered   Clinical Intake:  Pre-visit preparation completed: Yes  Pain : No/denies pain     Diabetes: No  How often do you need to have someone help you when you read instructions, pamphlets, or other written materials from your doctor or pharmacy?: 1 - Never  Interpreter Needed?: No  Information entered by :: Kandis Fantasia LPN   Activities of Daily Living    04/11/2023    9:00 AM  In your present state of health, do you have any difficulty performing the following activities:  Hearing? 0  Vision? 0  Difficulty concentrating or making decisions? 0  Walking or climbing stairs? 0  Dressing or bathing? 0  Doing errands, shopping? 0  Preparing Food and eating ? N  Using the Toilet? N  In the past six months, have you accidently leaked urine? N  Do you have problems with loss of bowel control? N  Managing your Medications? N  Managing your Finances? N   Housekeeping or managing your Housekeeping? N    Patient Care Team: Anabel Halon, MD as PCP - General (Internal Medicine)  Indicate any recent Medical Services you may have received from other than [facility] providers in the past year (date may be approximate).     Assessment:   This is a routine wellness examination for Jean Perez.  Hearing/Vision screen Hearing Screening - Comments:: Denies hearing difficulties   Vision Screening - Comments:: No vision problems; will schedule routine eye exam soon     Goals Addressed   None   Depression Screen    04/15/2023   12:47 PM 12/17/2022    9:38 AM 07/18/2022  8:10 AM 06/18/2022   10:01 AM 03/27/2022    9:37 AM 01/16/2022    8:51 AM 12/16/2021    8:09 AM  PHQ 2/9 Scores  PHQ - 2 Score 0 0 2 2 0 0 0  PHQ- 9 Score 6  8 8   0 0    Fall Risk    04/11/2023    9:00 AM 12/17/2022    9:38 AM 07/18/2022    8:10 AM 06/18/2022   10:01 AM 03/27/2022    9:37 AM  Fall Risk   Falls in the past year? 1 0 0 0 0  Number falls in past yr: 1 0 0 0 0  Injury with Fall? 0 0 0 0 0  Risk for fall due to : History of fall(s);Impaired mobility    No Fall Risks  Follow up Education provided;Falls prevention discussed;Falls evaluation completed    Falls evaluation completed    MEDICARE RISK AT HOME: Medicare Risk at Home Any stairs in or around the home?: Yes If so, are there any without handrails?: No Home free of loose throw rugs in walkways, pet beds, electrical cords, etc?: Yes Adequate lighting in your home to reduce risk of falls?: Yes Life alert?: No Use of a cane, walker or w/c?: No Grab bars in the bathroom?: No Shower chair or bench in shower?: No Elevated toilet seat or a handicapped toilet?: No  TIMED UP AND GO:  Was the test performed?  No    Cognitive Function:        04/15/2023   12:49 PM 03/27/2022    9:43 AM 01/17/2021    3:03 PM  6CIT Screen  What Year? 0 points 0 points 0 points  What month? 0 points 0 points 0 points  What  time? 0 points 0 points 0 points  Count back from 20 0 points 0 points 0 points  Months in reverse 2 points 0 points 0 points  Repeat phrase 4 points 10 points 0 points  Total Score 6 points 10 points 0 points    Immunizations Immunization History  Administered Date(s) Administered   Influenza Inj Mdck Quad Pf 02/25/2018   Influenza,inj,Quad PF,6+ Mos 01/17/2021, 02/08/2022   Influenza-Unspecified 02/23/2013, 02/25/2018, 03/01/2019   Moderna Sars-Covid-2 Vaccination 08/25/2019, 09/22/2019, 02/18/2023   Tdap 12/16/2021   Unspecified SARS-COV-2 Vaccination 05/03/2020   Zoster Recombinant(Shingrix) 12/16/2021, 02/19/2022    TDAP status: Up to date  Flu Vaccine status: Up to date  Pneumococcal vaccine status: Due, Education has been provided regarding the importance of this vaccine. Advised may receive this vaccine at local pharmacy or Health Dept. Aware to provide a copy of the vaccination record if obtained from local pharmacy or Health Dept. Verbalized acceptance and understanding.  Covid-19 vaccine status: Completed vaccines  Qualifies for Shingles Vaccine? Yes   Zostavax completed No   Shingrix Completed?: Yes  Screening Tests Health Maintenance  Topic Date Due   Pneumonia Vaccine 44+ Years old (1 of 2 - PCV) Never done   MAMMOGRAM  02/12/2023   DEXA SCAN  03/19/2023   COVID-19 Vaccine (5 - 2023-24 season) 04/15/2023   Medicare Annual Wellness (AWV)  04/14/2024   Fecal DNA (Cologuard)  12/01/2025   Cervical Cancer Screening (HPV/Pap Cotest)  03/05/2026   DTaP/Tdap/Td (2 - Td or Tdap) 12/17/2031   INFLUENZA VACCINE  Completed   Hepatitis C Screening  Completed   HIV Screening  Completed   Zoster Vaccines- Shingrix  Completed   HPV VACCINES  Aged Out  Health Maintenance  Health Maintenance Due  Topic Date Due   Pneumonia Vaccine 34+ Years old (1 of 2 - PCV) Never done   MAMMOGRAM  02/12/2023   DEXA SCAN  03/19/2023   COVID-19 Vaccine (5 - 2023-24 season)  04/15/2023    Colorectal cancer screening: Type of screening: Cologuard. Completed 12/02/22. Repeat every 3 years  Mammogram status: Ordered today. Pt provided with contact info and advised to call to schedule appt.   Bone Density status: Ordered today. Pt provided with contact info and advised to call to schedule appt.  Lung Cancer Screening: (Low Dose CT Chest recommended if Age 46-80 years, 20 pack-year currently smoking OR have quit w/in 15years.) does not qualify.   Lung Cancer Screening Referral: n/a  Additional Screening:  Hepatitis C Screening: does qualify; Completed 05/16/21  Vision Screening: Recommended annual ophthalmology exams for early detection of glaucoma and other disorders of the eye. Is the patient up to date with their annual eye exam?  No  Who is the provider or what is the name of the office in which the patient attends annual eye exams? none If pt is not established with a provider, would they like to be referred to a provider to establish care? No .   Dental Screening: Recommended annual dental exams for proper oral hygiene  Community Resource Referral / Chronic Care Management: CRR required this visit?  No   CCM required this visit?  No     Plan:     I have personally reviewed and noted the following in the patient's chart:   Medical and social history Use of alcohol, tobacco or illicit drugs  Current medications and supplements including opioid prescriptions. Patient is not currently taking opioid prescriptions. Functional ability and status Nutritional status Physical activity Advanced directives List of other physicians Hospitalizations, surgeries, and ER visits in previous 12 months Vitals Screenings to include cognitive, depression, and falls Referrals and appointments  In addition, I have reviewed and discussed with patient certain preventive protocols, quality metrics, and best practice recommendations. A written personalized care plan  for preventive services as well as general preventive health recommendations were provided to patient.     Kandis Fantasia [place], [place]   41/07/2438   After Visit Summary: (MyChart) Due to this being a telephonic visit, the after visit summary with patients personalized plan was offered to patient via MyChart   Nurse Notes: No concerns at this time

## 2023-04-15 NOTE — Patient Instructions (Signed)
Ms. Lehrer , Thank you for taking time to come for your Medicare Wellness Visit. I appreciate your ongoing commitment to your health goals. Please review the following plan we discussed and let me know if I can assist you in the future.   Referrals/Orders/Follow-Ups/Clinician Recommendations: Aim for 30 minutes of exercise or brisk walking, 6-8 glasses of water, and 5 servings of fruits and vegetables each day.  You have an order for:  []   2D Mammogram  [x]   3D Mammogram  [x]   Bone Density     Please call for appointment:   [facility] at [facility] [address] [place], [place] [address] [phone]  Make sure to wear two-piece clothing.  No lotions, powders, or deodorants the day of the appointment. Make sure to bring picture ID and insurance card.  Bring list of medications you are currently taking including any supplements.   Schedule your [place] screening mammogram through MyChart!   Log into your MyChart account.  Go to 'Visit' (or 'Appointments' if on mobile App) --> Schedule an Appointment  Under 'Select a Reason for Visit' choose the Mammogram Screening option.  Complete the pre-visit questions and select the time and place that best fits your schedule.    This is a list of the screening recommended for you and due dates:  Health Maintenance  Topic Date Due   Pneumonia Vaccine (1 of 2 - PCV) Never done   Mammogram  02/12/2023   DEXA scan (bone density measurement)  03/19/2023   COVID-19 Vaccine (5 - 2023-24 season) 04/15/2023   Medicare Annual Wellness Visit  04/14/2024   Cologuard (Stool DNA test)  12/01/2025   Pap with HPV screening  03/05/2026   DTaP/Tdap/Td vaccine (2 - Td or Tdap) 12/17/2031   Flu Shot  Completed   Hepatitis C Screening  Completed   HIV Screening  Completed   Zoster (Shingles) Vaccine  Completed   HPV Vaccine  Aged Out    Advanced directives: (ACP Link)Information on Advanced Care Planning can be found  at [facility] Advance Health Care Directives Advance Health Care Directives ([email or web address]   Next Medicare Annual Wellness Visit scheduled for next year: Yes

## 2023-04-20 ENCOUNTER — Ambulatory Visit: Payer: Medicare Other | Admitting: Internal Medicine

## 2023-04-21 DIAGNOSIS — E782 Mixed hyperlipidemia: Secondary | ICD-10-CM | POA: Diagnosis not present

## 2023-04-22 LAB — CMP14+EGFR
ALT: 18 [IU]/L (ref 0–32)
AST: 21 [IU]/L (ref 0–40)
Albumin: 4.7 g/dL (ref 3.9–4.9)
Alkaline Phosphatase: 103 [IU]/L (ref 44–121)
BUN/Creatinine Ratio: 10 — ABNORMAL LOW (ref 12–28)
BUN: 10 mg/dL (ref 8–27)
Bilirubin Total: 0.5 mg/dL (ref 0.0–1.2)
CO2: 20 mmol/L (ref 20–29)
Calcium: 9.6 mg/dL (ref 8.7–10.3)
Chloride: 99 mmol/L (ref 96–106)
Creatinine, Ser: 1.02 mg/dL — ABNORMAL HIGH (ref 0.57–1.00)
Globulin, Total: 3.1 g/dL (ref 1.5–4.5)
Glucose: 82 mg/dL (ref 70–99)
Potassium: 4.9 mmol/L (ref 3.5–5.2)
Sodium: 138 mmol/L (ref 134–144)
Total Protein: 7.8 g/dL (ref 6.0–8.5)
eGFR: 61 mL/min/{1.73_m2} (ref >59)

## 2023-04-22 LAB — LIPID PANEL
Chol/HDL Ratio: 3.6 {ratio} (ref 0.0–4.4)
Cholesterol, Total: 244 mg/dL — ABNORMAL HIGH (ref 100–199)
HDL: 67 mg/dL (ref >39)
LDL Chol Calc (NIH): 159 mg/dL — ABNORMAL HIGH (ref 0–99)
Triglycerides: 101 mg/dL (ref 0–149)
VLDL Cholesterol Cal: 18 mg/dL (ref 5–40)

## 2023-04-28 ENCOUNTER — Encounter: Payer: Self-pay | Admitting: Internal Medicine

## 2023-04-28 ENCOUNTER — Ambulatory Visit (INDEPENDENT_AMBULATORY_CARE_PROVIDER_SITE_OTHER): Payer: Medicare Other | Admitting: Internal Medicine

## 2023-04-28 VITALS — BP 117/80 | HR 98 | Ht <= 58 in | Wt 100.4 lb

## 2023-04-28 DIAGNOSIS — F419 Anxiety disorder, unspecified: Secondary | ICD-10-CM | POA: Diagnosis not present

## 2023-04-28 DIAGNOSIS — E559 Vitamin D deficiency, unspecified: Secondary | ICD-10-CM | POA: Diagnosis not present

## 2023-04-28 DIAGNOSIS — G90512 Complex regional pain syndrome I of left upper limb: Secondary | ICD-10-CM | POA: Diagnosis not present

## 2023-04-28 DIAGNOSIS — E782 Mixed hyperlipidemia: Secondary | ICD-10-CM | POA: Diagnosis not present

## 2023-04-28 DIAGNOSIS — R739 Hyperglycemia, unspecified: Secondary | ICD-10-CM | POA: Diagnosis not present

## 2023-04-28 MED ORDER — ROSUVASTATIN CALCIUM 10 MG PO TABS: 10.0000 mg | ORAL_TABLET | Freq: Every day | ORAL | 1 refills | Status: AC

## 2023-04-28 MED ORDER — DULOXETINE HCL 20 MG PO CPEP: 20.0000 mg | ORAL_CAPSULE | Freq: Every day | ORAL | 3 refills | Status: AC

## 2023-04-28 NOTE — Patient Instructions (Signed)
Please start taking Rosuvastatin 10 mg once daily - in the evening.  Please start taking Duloxetine 20 mg once daily for complex pain and anxiety.  Please continue to follow low cholesterol diet and perform moderate exercise/walking at least 150 mins/week.

## 2023-04-28 NOTE — Assessment & Plan Note (Signed)
Checked Vitamin D level DC Vitamin D 50,000 IU Continue Vitamin D 2000 IU QD

## 2023-04-28 NOTE — Assessment & Plan Note (Addendum)
Lipid profile reviewed - LDL worse compared to prior She has stopped taking Crestor Restart Crestor at 10 mg once daily - she agrees to take it regularly now Advised to follow low cholesterol diet

## 2023-04-28 NOTE — Assessment & Plan Note (Signed)
Was on Lexapro Has chronic anxiety Started Cymbalta for neuropathic pain, can also help with GAD

## 2023-04-28 NOTE — Assessment & Plan Note (Addendum)
Was well-controlled with Gabapentin, but was having dizziness, fatigue and anxiety with it, has stopped taking it Has had stellate cervical blocks in the past, does not see Neurology now Had started Cymbalta 20 mg daily, plan to increase the dose as tolerated -Cymbalta can help with MDD and anxiety as well, she agrees to start taking it now

## 2023-04-28 NOTE — Progress Notes (Signed)
Established Patient Office Visit  Subjective:  Patient ID: Jean Perez, female    DOB: Dec 17, 1955  Age: 67 y.o. MRN: [id number]  CC:  Chief Complaint  Patient presents with   Hyperlipidemia    Four month follow up    Pain    Four month follow up     HPI Jean Perez is a 67 y.o. 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  She continues to have severe pain over left UE.  She prefers to not take gabapentin for complex regional pain syndrome of left UE.  She has mild numbness over left UE, but strength is intact.  She was given duloxetine for pain and anxiety, but she did not continue it.  After discussion, she agrees to start taking it.  Her lipid profile shows persistently elevated LDL, worse than prior with diet modification and exercise.  Of note, she had dropped taking Crestor as she wanted to see if she could manage with diet and exercise alone.  She agrees to start taking Crestor again.   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     Past Surgical History:  Procedure Laterality Date   arm surgery     CARPAL TUNNEL RELEASE     left elbow      Family History  Problem Relation Age of Onset   Glaucoma Paternal Grandfather    Heart disease Father    Hypertension Mother    Breast cancer Maternal Aunt    Stroke Other    Heart disease Other    High blood pressure Other     Social History   Socioeconomic History   Marital status: Media planner    Spouse name: Not on file   Number of children: 2   Years of education: HS   Highest education level: Not on file  Occupational History    Employer: UNEMPLOYED    Comment: disabled  Tobacco Use   Smoking status: Every Day    Current  packs/day: 0.25    Types: Cigarettes   Smokeless tobacco: Never  Vaping Use   Vaping status: Never Used  Substance and Sexual Activity   Alcohol use: Yes    Comment: occ   Drug use: No   Sexual activity: Not Currently    Birth control/protection: Post-menopausal  Other Topics Concern   Not on file  Social History Narrative   Patient is disabled. And she is divorced.    Caffeine one pot of coffee daily.   Left handed.   Social Drivers of Corporate investment banker Strain: Low Risk  (04/11/2023)   Overall Financial Resource Strain (CARDIA)    Difficulty of Paying Living Expenses: Not very hard  Food Insecurity: No Food Insecurity (04/11/2023)   Hunger Vital Sign    Worried About Running Out of Food in the Last Year: Never true    Ran Out of Food in the Last Year: Never true  Transportation Needs: No Transportation Needs (04/11/2023)   PRAPARE - Administrator, Civil Service (Medical): No    Lack of Transportation (Non-Medical): No  Physical Activity: Sufficiently Active (04/11/2023)   Exercise Vital Sign    Days of  Exercise per Week: 5 days    Minutes of Exercise per Session: 60 min  Stress: Stress Concern Present (04/11/2023)   [employer] of Occupational Health - Occupational Stress Questionnaire    Feeling of Stress : Very much  Social Connections: Moderately Isolated (04/11/2023)   Social Connection and Isolation Panel [NHANES]    Frequency of Communication with Friends and Family: Three times a week    Frequency of Social Gatherings with Friends and Family: Once a week    Attends Religious Services: Never    Database administrator or Organizations: No    Attends Banker Meetings: Never    Marital Status: Living with partner  Intimate Partner Violence: Not At Risk (04/15/2023)   Humiliation, Afraid, Rape, and Kick questionnaire    Fear of Current or Ex-Partner: No    Emotionally Abused: No    Physically Abused: No    Sexually Abused:  No    Outpatient Medications Prior to Visit  Medication Sig Dispense Refill   albuterol (VENTOLIN HFA) 108 (90 Base) MCG/ACT inhaler Inhale 2 puffs into the lungs every 6 (six) hours as needed for wheezing or shortness of breath. 8 g 1   calcium-vitamin D (OSCAL WITH D) 500-200 MG-UNIT per tablet Take 1 tablet by mouth daily.     ondansetron (ZOFRAN) 4 MG tablet Take 1 tablet (4 mg total) by mouth every 8 (eight) hours as needed for nausea or vomiting. 20 tablet 0   DULoxetine (CYMBALTA) 20 MG capsule Take 1 capsule (20 mg total) by mouth daily. 30 capsule 3   rosuvastatin (CRESTOR) 5 MG tablet Take 1 tablet (5 mg total) by mouth daily. 90 tablet 3   No facility-administered medications prior to visit.    Allergies  Allergen Reactions   Penicillins Hives, Rash and Other (See Comments)    Has patient had a PCN reaction causing immediate rash, facial/tongue/throat swelling, SOB or lightheadedness with hypotension: Yes Has patient had a PCN reaction causing severe rash involving mucus membranes or skin necrosis: Yes Has patient had a PCN reaction that required hospitalization No Has patient had a PCN reaction occurring within the last 10 years: No If all of the above answers are "NO", then may proceed with Cephalosporin use.   Other Reaction: BRONCHOSPASM    ROS Review of Systems  Constitutional:  Negative for chills and fever.  HENT:  Negative for congestion, sinus pressure, sinus pain and sore throat.   Eyes:  Negative for pain and discharge.  Respiratory:  Negative for cough and shortness of breath.   Cardiovascular:  Negative for chest pain and palpitations.  Gastrointestinal:  Negative for abdominal pain, diarrhea, nausea and vomiting.  Endocrine: Negative for polydipsia and polyuria.  Genitourinary:  Negative for dysuria and hematuria.  Musculoskeletal:  Negative for neck pain and neck stiffness.       LUE pain  Skin:  Negative for rash.  Neurological:  Negative for  dizziness and weakness.  Psychiatric/Behavioral:  Positive for sleep disturbance. Negative for agitation and behavioral problems. The patient is nervous/anxious.       Objective:    Physical Exam Vitals reviewed.  Constitutional:      General: She is not in acute distress.    Appearance: She is not diaphoretic.  HENT:     Head: Normocephalic and atraumatic.     Nose: Nose normal.     Mouth/Throat:     Mouth: Mucous membranes are moist.  Eyes:     General: No  scleral icterus.    Extraocular Movements: Extraocular movements intact.  Cardiovascular:     Rate and Rhythm: Normal rate and regular rhythm.     Pulses: Normal pulses.     Heart sounds: Normal heart sounds. No murmur heard. Pulmonary:     Breath sounds: Normal breath sounds. No wheezing or rales.  Musculoskeletal:     Cervical back: Neck supple. No tenderness.     Right lower leg: No edema.     Left lower leg: No edema.  Skin:    General: Skin is warm.     Findings: No rash.  Neurological:     General: No focal deficit present.     Mental Status: She is alert and oriented to person, place, and time.     Sensory: No sensory deficit.     Motor: No weakness.  Psychiatric:        Mood and Affect: Mood normal.        Behavior: Behavior normal.     BP 117/80 (BP Location: Right Arm, Patient Position: Sitting, Cuff Size: Normal)   Pulse 98   Ht 4\' 10"  (1.473 m)   Wt 100 lb 6.4 oz (45.5 kg)   LMP  (LMP Unknown)   SpO2 96%   BMI 20.98 kg/m  Wt Readings from Last 3 Encounters:  04/28/23 100 lb 6.4 oz (45.5 kg)  04/15/23 96 lb (43.5 kg)  12/17/22 96 lb 9.6 oz (43.8 kg)    Lab Results  Component Value Date   TSH 3.810 06/10/2022   Lab Results  Component Value Date   WBC 17.5 (H) 06/10/2022   HGB 14.6 06/10/2022   HCT 41.7 06/10/2022   MCV 93 06/10/2022   PLT 333 06/10/2022   Lab Results  Component Value Date   NA 138 04/21/2023   K 4.9 04/21/2023   CO2 20 04/21/2023   GLUCOSE 82 04/21/2023   BUN  10 04/21/2023   CREATININE 1.02 (H) 04/21/2023   BILITOT 0.5 04/21/2023   ALKPHOS 103 04/21/2023   AST 21 04/21/2023   ALT 18 04/21/2023   PROT 7.8 04/21/2023   ALBUMIN 4.7 04/21/2023   CALCIUM 9.6 04/21/2023   ANIONGAP 7 02/15/2016   EGFR 61 04/21/2023   Lab Results  Component Value Date   CHOL 244 (H) 04/21/2023   Lab Results  Component Value Date   HDL 67 04/21/2023   Lab Results  Component Value Date   LDLCALC 159 (H) 04/21/2023   Lab Results  Component Value Date   TRIG 101 04/21/2023   Lab Results  Component Value Date   CHOLHDL 3.6 04/21/2023   No results found for: "HGBA1C"    Assessment & Plan:   Problem List Items Addressed This Visit       Nervous and Auditory   Complex regional pain syndrome of left upper extremity   Was well-controlled with Gabapentin, but was having dizziness, fatigue and anxiety with it, has stopped taking it Has had stellate cervical blocks in the past, does not see Neurology now Had started Cymbalta 20 mg daily, plan to increase the dose as tolerated -Cymbalta can help with MDD and anxiety as well, she agrees to start taking it now      Relevant Medications   DULoxetine (CYMBALTA) 20 MG capsule   Other Relevant Orders   TSH   CBC     Other   Anxiety   Was on Lexapro Has chronic anxiety Started Cymbalta for neuropathic pain, can also help with  GAD      Relevant Medications   DULoxetine (CYMBALTA) 20 MG capsule   Other Relevant Orders   TSH   Vitamin D deficiency   Checked Vitamin D level DC Vitamin D 50,000 IU Continue Vitamin D 2000 IU QD      Relevant Orders   Vitamin D (25 hydroxy)   Mixed hyperlipidemia - Primary   Lipid profile reviewed - LDL worse compared to prior She has stopped taking Crestor Restart Crestor at 10 mg once daily - she agrees to take it regularly now Advised to follow low cholesterol diet      Relevant Medications   rosuvastatin (CRESTOR) 10 MG tablet   Other Relevant Orders    CMP14+EGFR   Lipid Profile   Other Visit Diagnoses       Hyperglycemia       Relevant Orders   Hemoglobin A1c         Meds ordered this encounter  Medications   rosuvastatin (CRESTOR) 10 MG tablet    Sig: Take 1 tablet (10 mg total) by mouth daily.    Dispense:  90 tablet    Refill:  1   DULoxetine (CYMBALTA) 20 MG capsule    Sig: Take 1 capsule (20 mg total) by mouth daily.    Dispense:  30 capsule    Refill:  3    Follow-up: Return in about 4 months (around 08/27/2023) for HLD.    Anabel Halon, MD

## 2023-05-20 ENCOUNTER — Ambulatory Visit (HOSPITAL_COMMUNITY): Payer: Medicare Other

## 2023-05-20 ENCOUNTER — Other Ambulatory Visit (HOSPITAL_COMMUNITY): Payer: Medicare Other

## 2023-07-17 ENCOUNTER — Ambulatory Visit (HOSPITAL_COMMUNITY)
Admission: RE | Admit: 2023-07-17 | Discharge: 2023-07-17 | Disposition: A | Discharge status: Home / Self Care | Payer: Medicare Other | Source: Ambulatory Visit | Attending: Internal Medicine | Admitting: Internal Medicine

## 2023-07-17 DIAGNOSIS — Z1231 Encounter for screening mammogram for malignant neoplasm of breast: Secondary | ICD-10-CM | POA: Diagnosis not present

## 2023-07-17 DIAGNOSIS — Z780 Asymptomatic menopausal state: Secondary | ICD-10-CM | POA: Diagnosis not present

## 2023-07-17 DIAGNOSIS — M8589 Other specified disorders of bone density and structure, multiple sites: Secondary | ICD-10-CM | POA: Diagnosis not present

## 2023-07-22 ENCOUNTER — Other Ambulatory Visit (HOSPITAL_COMMUNITY): Payer: Self-pay | Admitting: Internal Medicine

## 2023-07-22 DIAGNOSIS — R928 Other abnormal and inconclusive findings on diagnostic imaging of breast: Secondary | ICD-10-CM

## 2023-07-28 ENCOUNTER — Ambulatory Visit (HOSPITAL_COMMUNITY)
Admission: RE | Admit: 2023-07-28 | Discharge: 2023-07-28 | Discharge status: Home / Self Care | Source: Ambulatory Visit | Attending: Internal Medicine

## 2023-07-28 ENCOUNTER — Encounter (HOSPITAL_COMMUNITY): Payer: Self-pay

## 2023-07-28 ENCOUNTER — Ambulatory Visit (HOSPITAL_COMMUNITY)
Admission: RE | Admit: 2023-07-28 | Discharge: 2023-07-28 | Disposition: A | Discharge status: Home / Self Care | Source: Ambulatory Visit | Attending: Internal Medicine | Admitting: Internal Medicine

## 2023-07-28 DIAGNOSIS — R92322 Mammographic fibroglandular density, left breast: Secondary | ICD-10-CM | POA: Diagnosis not present

## 2023-07-28 DIAGNOSIS — R928 Other abnormal and inconclusive findings on diagnostic imaging of breast: Secondary | ICD-10-CM | POA: Insufficient documentation

## 2023-07-28 DIAGNOSIS — N6002 Solitary cyst of left breast: Secondary | ICD-10-CM | POA: Diagnosis not present

## 2023-07-28 DIAGNOSIS — N6321 Unspecified lump in the left breast, upper outer quadrant: Secondary | ICD-10-CM | POA: Diagnosis not present

## 2023-08-06 ENCOUNTER — Ambulatory Visit (HOSPITAL_COMMUNITY)

## 2023-08-06 ENCOUNTER — Encounter (HOSPITAL_COMMUNITY)

## 2023-08-26 DIAGNOSIS — E782 Mixed hyperlipidemia: Secondary | ICD-10-CM | POA: Diagnosis not present

## 2023-08-26 DIAGNOSIS — F419 Anxiety disorder, unspecified: Secondary | ICD-10-CM | POA: Diagnosis not present

## 2023-08-26 DIAGNOSIS — R739 Hyperglycemia, unspecified: Secondary | ICD-10-CM | POA: Diagnosis not present

## 2023-08-26 DIAGNOSIS — E559 Vitamin D deficiency, unspecified: Secondary | ICD-10-CM | POA: Diagnosis not present

## 2023-08-26 DIAGNOSIS — G90512 Complex regional pain syndrome I of left upper limb: Secondary | ICD-10-CM | POA: Diagnosis not present

## 2023-08-27 LAB — HEMOGLOBIN A1C
Est. average glucose Bld gHb Est-mCnc: 120 mg/dL
Hgb A1c MFr Bld: 5.8 % — ABNORMAL HIGH (ref 4.8–5.6)

## 2023-08-27 LAB — LIPID PANEL
Chol/HDL Ratio: 2.3 ratio (ref 0.0–4.4)
Cholesterol, Total: 111 mg/dL (ref 100–199)
HDL: 48 mg/dL (ref >39)
LDL Chol Calc (NIH): 49 mg/dL (ref 0–99)
Triglycerides: 65 mg/dL (ref 0–149)
VLDL Cholesterol Cal: 14 mg/dL (ref 5–40)

## 2023-08-27 LAB — CMP14+EGFR
ALT: 19 IU/L (ref 0–32)
AST: 21 IU/L (ref 0–40)
Albumin: 4.5 g/dL (ref 3.9–4.9)
Alkaline Phosphatase: 85 IU/L (ref 44–121)
BUN/Creatinine Ratio: 12 (ref 12–28)
BUN: 12 mg/dL (ref 8–27)
Bilirubin Total: 0.2 mg/dL (ref 0.0–1.2)
CO2: 19 mmol/L — ABNORMAL LOW (ref 20–29)
Calcium: 9 mg/dL (ref 8.7–10.3)
Chloride: 103 mmol/L (ref 96–106)
Creatinine, Ser: 1.01 mg/dL — ABNORMAL HIGH (ref 0.57–1.00)
Globulin, Total: 2.4 g/dL (ref 1.5–4.5)
Glucose: 94 mg/dL (ref 70–99)
Potassium: 4.7 mmol/L (ref 3.5–5.2)
Sodium: 136 mmol/L (ref 134–144)
Total Protein: 6.9 g/dL (ref 6.0–8.5)
eGFR: 62 mL/min/{1.73_m2} (ref >59)

## 2023-08-27 LAB — CBC
Hematocrit: 40.1 % (ref 34.0–46.6)
Hemoglobin: 13.4 g/dL (ref 11.1–15.9)
MCH: 32.4 pg (ref 26.6–33.0)
MCHC: 33.4 g/dL (ref 31.5–35.7)
MCV: 97 fL (ref 79–97)
Platelets: 297 10*3/uL (ref 150–450)
RBC: 4.14 x10E6/uL (ref 3.77–5.28)
RDW: 11.8 % (ref 11.7–15.4)
WBC: 10.8 10*3/uL (ref 3.4–10.8)

## 2023-08-27 LAB — TSH: TSH: 2.14 u[IU]/mL (ref 0.450–4.500)

## 2023-08-27 LAB — VITAMIN D 25 HYDROXY (VIT D DEFICIENCY, FRACTURES): Vit D, 25-Hydroxy: 45.6 ng/mL (ref 30.0–100.0)

## 2023-09-04 ENCOUNTER — Encounter: Payer: Self-pay | Admitting: Internal Medicine

## 2023-09-04 ENCOUNTER — Ambulatory Visit (INDEPENDENT_AMBULATORY_CARE_PROVIDER_SITE_OTHER): Payer: Medicare Other | Admitting: Internal Medicine

## 2023-09-04 VITALS — BP 128/85 | HR 100 | Ht <= 58 in | Wt 100.6 lb

## 2023-09-04 DIAGNOSIS — F17210 Nicotine dependence, cigarettes, uncomplicated: Secondary | ICD-10-CM

## 2023-09-04 DIAGNOSIS — F419 Anxiety disorder, unspecified: Secondary | ICD-10-CM

## 2023-09-04 DIAGNOSIS — G90512 Complex regional pain syndrome I of left upper limb: Secondary | ICD-10-CM | POA: Diagnosis not present

## 2023-09-04 DIAGNOSIS — Z720 Tobacco use: Secondary | ICD-10-CM

## 2023-09-04 DIAGNOSIS — N3281 Overactive bladder: Secondary | ICD-10-CM | POA: Diagnosis not present

## 2023-09-04 DIAGNOSIS — E782 Mixed hyperlipidemia: Secondary | ICD-10-CM

## 2023-09-04 MED ORDER — OXYBUTYNIN CHLORIDE ER 10 MG PO TB24: 10.0000 mg | ORAL_TABLET | Freq: Every day | ORAL | 3 refills | Status: DC

## 2023-09-04 NOTE — Patient Instructions (Signed)
 Please start taking Oxybutynin  for overactive bladder.  Please start taking Rosuvastatin  every other day for now.  Please start taking Duloxetine  as prescribed for anxiety.  Please continue to follow low cholesterol diet and perform moderate exercise/walking at least 150 mins/week.

## 2023-09-04 NOTE — Assessment & Plan Note (Addendum)
 Was on Lexapro  Has chronic anxiety Started Cymbalta  for neuropathic pain, can also help with GAD - needs to start taking it

## 2023-09-04 NOTE — Assessment & Plan Note (Addendum)
 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

## 2023-09-04 NOTE — Assessment & Plan Note (Signed)
 Smokes about 8 cigarettes/day  Asked about quitting: confirms that she currently smokes cigarettes Advise to quit smoking: Educated about QUITTING to reduce the risk of cancer, cardio and cerebrovascular disease. Assess willingness: Unwilling to quit at this time, but is working on cutting back. Assist with counseling and pharmacotherapy: Counseled for 5 minutes. Uses nicotine gums. Arrange for follow up: Follow up in 3 months and continue to offer help.

## 2023-09-04 NOTE — Assessment & Plan Note (Addendum)
 Uncontrolled Previous UAs have been negative for UTI Started oxybutynin  10 mg nightly If persistent symptoms, will refer to Urogynecology

## 2023-09-04 NOTE — Progress Notes (Signed)
 Established Patient Office Visit  Subjective:  Patient ID: Jean Perez, female    DOB: 06-13-1955  Age: 68 y.o. MRN: [id number]  CC:  Chief Complaint  Patient presents with   Medical Management of Chronic Issues    4 month f/u , reports concerns about kidneys and blood work.     HPI Jean Perez is a 68 y.o. 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  She continues to have severe pain over left UE.  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  She has mild numbness over left UE, but strength is intact.  She was given duloxetine  for pain and anxiety, but she did not continue it.  Her lipid profile shows improved LDL, better than prior with Crestor , diet modification and exercise.  Of note, she had stopped taking Crestor  previously as she wanted to see if she could manage with diet and exercise alone.  Overactive bladder: She reports chronic urinary urgency.  She has to use bathroom every 20 minutes or so, has minimal urine output each time.  She has had 2 episodes of urinary leakage.  Denies dysuria or hematuria.  She has been evaluated by urology in the past, but she did not opt to take medicine at that time. She has previously undergone urethral dilation by Dr. Lavona Pounds in 2002 and 2008. No history of frequent UTIs.    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     Past Surgical History:  Procedure Laterality Date   arm surgery     CARPAL TUNNEL RELEASE     left elbow      Family History  Problem Relation Age of Onset   Glaucoma Paternal Grandfather    Heart disease Father    Hypertension Mother    Breast cancer Maternal Aunt    Stroke Other    Heart  disease Other    High blood pressure Other     Social History   Socioeconomic History   Marital status: Media planner    Spouse name: Not on file   Number of children: 2   Years of education: HS   Highest education level: Not on file  Occupational History    Employer: UNEMPLOYED    Comment: disabled  Tobacco Use   Smoking status: Every Day    Current packs/day: 0.25    Types: Cigarettes   Smokeless tobacco: Never  Vaping Use   Vaping status: Never Used  Substance and Sexual Activity   Alcohol use: Yes    Comment: occ   Drug use: No   Sexual activity: Not Currently    Birth control/protection: Post-menopausal  Other Topics Concern   Not on file  Social History Narrative   Patient is disabled. And she is divorced.    Caffeine one pot of coffee daily.   Left handed.   Social Drivers of Corporate investment banker Strain: Low Risk  (04/11/2023)   Overall Financial Resource Strain (CARDIA)    Difficulty of Paying Living Expenses: Not very hard  Food Insecurity: No Food Insecurity (04/11/2023)   Hunger Vital Sign    Worried About Running Out of  Food in the Last Year: Never true    Ran Out of Food in the Last Year: Never true  Transportation Needs: No Transportation Needs (04/11/2023)   PRAPARE - Administrator, Civil Service (Medical): No    Lack of Transportation (Non-Medical): No  Physical Activity: Sufficiently Active (04/11/2023)   Exercise Vital Sign    Days of Exercise per Week: 5 days    Minutes of Exercise per Session: 60 min  Stress: Stress Concern Present (04/11/2023)   [employer] of Occupational Health - Occupational Stress Questionnaire    Feeling of Stress : Very much  Social Connections: Moderately Isolated (04/11/2023)   Social Connection and Isolation Panel [NHANES]    Frequency of Communication with Friends and Family: Three times a week    Frequency of Social Gatherings with Friends and Family: Once a week    Attends  Religious Services: Never    Database administrator or Organizations: No    Attends Banker Meetings: Never    Marital Status: Living with partner  Intimate Partner Violence: Not At Risk (04/15/2023)   Humiliation, Afraid, Rape, and Kick questionnaire    Fear of Current or Ex-Partner: No    Emotionally Abused: No    Physically Abused: No    Sexually Abused: No    Outpatient Medications Prior to Visit  Medication Sig Dispense Refill   albuterol  (VENTOLIN  HFA) 108 (90 Base) MCG/ACT inhaler Inhale 2 puffs into the lungs every 6 (six) hours as needed for wheezing or shortness of breath. 8 g 1   Calcium  Carbonate-Vit D-Min (CALTRATE 600+D PLUS MINERALS PO) Take by mouth.     ondansetron  (ZOFRAN ) 4 MG tablet Take 1 tablet (4 mg total) by mouth every 8 (eight) hours as needed for nausea or vomiting. 20 tablet 0   rosuvastatin  (CRESTOR ) 10 MG tablet Take 1 tablet (10 mg total) by mouth daily. 90 tablet 1   DULoxetine  (CYMBALTA ) 20 MG capsule Take 1 capsule (20 mg total) by mouth daily. (Patient not taking: Reported on 09/04/2023) 30 capsule 3   calcium -vitamin D  (OSCAL WITH D) 500-200 MG-UNIT per tablet Take 1 tablet by mouth daily.     No facility-administered medications prior to visit.    Allergies  Allergen Reactions   Penicillins Hives, Rash and Other (See Comments)    Has patient had a PCN reaction causing immediate rash, facial/tongue/throat swelling, SOB or lightheadedness with hypotension: Yes Has patient had a PCN reaction causing severe rash involving mucus membranes or skin necrosis: Yes Has patient had a PCN reaction that required hospitalization No Has patient had a PCN reaction occurring within the last 10 years: No If all of the above answers are "NO", then may proceed with Cephalosporin use.   Other Reaction: BRONCHOSPASM    ROS Review of Systems  Constitutional:  Negative for chills and fever.  HENT:  Negative for congestion, sinus pressure, sinus pain and  sore throat.   Eyes:  Negative for pain and discharge.  Respiratory:  Negative for cough and shortness of breath.   Cardiovascular:  Negative for chest pain and palpitations.  Gastrointestinal:  Negative for abdominal pain, diarrhea, nausea and vomiting.  Endocrine: Negative for polydipsia and polyuria.  Genitourinary:  Positive for urgency. Negative for dysuria and hematuria.  Musculoskeletal:  Negative for neck pain and neck stiffness.       LUE pain  Skin:  Negative for rash.  Neurological:  Negative for dizziness and weakness.  Psychiatric/Behavioral:  Positive  for sleep disturbance. Negative for agitation and behavioral problems. The patient is nervous/anxious.       Objective:    Physical Exam Vitals reviewed.  Constitutional:      General: She is not in acute distress.    Appearance: She is not diaphoretic.  HENT:     Head: Normocephalic and atraumatic.     Nose: Nose normal.     Mouth/Throat:     Mouth: Mucous membranes are moist.  Eyes:     General: No scleral icterus.    Extraocular Movements: Extraocular movements intact.  Cardiovascular:     Rate and Rhythm: Normal rate and regular rhythm.     Heart sounds: Normal heart sounds. No murmur heard. Pulmonary:     Breath sounds: Normal breath sounds. No wheezing or rales.  Musculoskeletal:     Cervical back: Neck supple. No tenderness.     Right lower leg: No edema.     Left lower leg: No edema.  Skin:    General: Skin is warm.     Findings: No rash.  Neurological:     General: No focal deficit present.     Mental Status: She is alert and oriented to person, place, and time.     Sensory: No sensory deficit.     Motor: No weakness.  Psychiatric:        Mood and Affect: Mood normal.        Behavior: Behavior normal.     BP 128/85   Pulse 100   Ht 4\' 10"  (1.473 m)   Wt 100 lb 9.6 oz (45.6 kg)   LMP  (LMP Unknown)   SpO2 98%   BMI 21.03 kg/m  Wt Readings from Last 3 Encounters:  09/04/23 100 lb 9.6 oz  (45.6 kg)  04/28/23 100 lb 6.4 oz (45.5 kg)  04/15/23 96 lb (43.5 kg)    Lab Results  Component Value Date   TSH 2.140 08/26/2023   Lab Results  Component Value Date   WBC 10.8 08/26/2023   HGB 13.4 08/26/2023   HCT 40.1 08/26/2023   MCV 97 08/26/2023   PLT 297 08/26/2023   Lab Results  Component Value Date   NA 136 08/26/2023   K 4.7 08/26/2023   CO2 19 (L) 08/26/2023   GLUCOSE 94 08/26/2023   BUN 12 08/26/2023   CREATININE 1.01 (H) 08/26/2023   BILITOT 0.2 08/26/2023   ALKPHOS 85 08/26/2023   AST 21 08/26/2023   ALT 19 08/26/2023   PROT 6.9 08/26/2023   ALBUMIN 4.5 08/26/2023   CALCIUM  9.0 08/26/2023   ANIONGAP 7 02/15/2016   EGFR 62 08/26/2023   Lab Results  Component Value Date   CHOL 111 08/26/2023   Lab Results  Component Value Date   HDL 48 08/26/2023   Lab Results  Component Value Date   LDLCALC 49 08/26/2023   Lab Results  Component Value Date   TRIG 65 08/26/2023   Lab Results  Component Value Date   CHOLHDL 2.3 08/26/2023   Lab Results  Component Value Date   HGBA1C 5.8 (H) 08/26/2023      Assessment & Plan:   Problem List Items Addressed This Visit       Nervous and Auditory   Complex regional pain syndrome of left upper extremity   Was well-controlled with Gabapentin , but was having dizziness, fatigue and anxiety with it, has stopped taking it Has had stellate cervical blocks in the past, does not see Neurology now Had  started Cymbalta  20 mg daily, plan to increase the dose as tolerated -Cymbalta  can help with MDD and anxiety as well, but she did not continue it, agrees to try it now        Genitourinary   Overactive bladder   Uncontrolled Previous UAs have been negative for UTI Started oxybutynin  10 mg nightly If persistent symptoms, will refer to Urogynecology      Relevant Medications   oxybutynin  (DITROPAN -XL) 10 MG 24 hr tablet     Other   Anxiety   Was on Lexapro  Has chronic anxiety Started Cymbalta  for  neuropathic pain, can also help with GAD - needs to start taking it      Tobacco abuse   Smokes about 8 cigarettes/day  Asked about quitting: confirms that she currently smokes cigarettes Advise to quit smoking: Educated about QUITTING to reduce the risk of cancer, cardio and cerebrovascular disease. Assess willingness: Unwilling to quit at this time, but is working on cutting back. Assist with counseling and pharmacotherapy: Counseled for 5 minutes. Uses nicotine gums. Arrange for follow up: Follow up in 3 months and continue to offer help.      Mixed hyperlipidemia - Primary   Lipid profile reviewed - LDL significantly improved compared to prior Crestor  10 mg once daily - she agrees to take it every other day for now, she prefers to take less medicines Advised to follow low cholesterol diet          Meds ordered this encounter  Medications   oxybutynin  (DITROPAN -XL) 10 MG 24 hr tablet    Sig: Take 1 tablet (10 mg total) by mouth at bedtime.    Dispense:  30 tablet    Refill:  3    Follow-up: Return in about 4 months (around 01/04/2024) for Overactive bladder and GAD.    Meldon Sport, MD

## 2023-09-04 NOTE — Assessment & Plan Note (Signed)
 Lipid profile reviewed - LDL significantly improved compared to prior Crestor  10 mg once daily - she agrees to take it every other day for now, she prefers to take less medicines Advised to follow low cholesterol diet

## 2023-10-13 ENCOUNTER — Ambulatory Visit: Payer: Self-pay

## 2023-10-13 NOTE — Telephone Encounter (Signed)
 Patient scheduled.

## 2023-10-13 NOTE — Telephone Encounter (Signed)
 Copied from CRM [id number]. Topic: Clinical - Red Word Triage >> Oct 13, 2023 11:11 AM Kevelyn M wrote: Red Word that prompted transfer to Nurse Triage: Patient said she is experiencing possible vertigo. Patient experience Nausea, diarrhea, with dizziness. Started off with diarrhea and then nausea. Now she's experiencing dizziness.   Chief Complaint: Diarrhea  Symptoms: Diarrhea, nausea, dizziness, abdominal cramping  Frequency: Frequent  Pertinent Negatives: Patient denies vomiting  Disposition: [] ED /[] Urgent Care (no appt availability in office) / [x] Appointment(In office/virtual)/ []  [facility]/ [] Home Care/ [] Refused Recommended Disposition /[] [facility]/ []  Follow-up with PCP Additional Notes: Patient reports diarrhea for the last 10 days. She states she is also experiencing nausea and some mild dizziness. She denies any vomiting. Appointment made for the patient tomorrow for evaluation.     Reason for Disposition  [1] MODERATE diarrhea (e.g., 4-6 times / day more than normal) AND [2] present > 48 hours (2 days)  Answer Assessment - Initial Assessment Questions 1. DIARRHEA SEVERITY: "How bad is the diarrhea?" "How many more stools have you had in the past 24 hours than normal?"    - NO DIARRHEA (SCALE 0)   - MILD (SCALE 1-3): Few loose or mushy BMs; increase of 1-3 stools over normal daily number of stools; mild increase in ostomy output.   -  MODERATE (SCALE 4-7): Increase of 4-6 stools daily over normal; moderate increase in ostomy output.   -  SEVERE (SCALE 8-10; OR "WORST POSSIBLE"): Increase of 7 or more stools daily over normal; moderate increase in ostomy output; incontinence.  Mild to moderate  2. ONSET: "When did the diarrhea begin?"      10 days ago  3. BM CONSISTENCY: "How loose or watery is the diarrhea?"      Loose  4. VOMITING: "Are you also vomiting?" If Yes, ask: "How many times in the past 24 hours?"      No 5. ABDOMEN PAIN: "Are you having  any abdomen pain?" If Yes, ask: "What does it feel like?" (e.g., crampy, dull, intermittent, constant)      Yes, intermittent crampy pain  6. ABDOMEN PAIN SEVERITY: If present, ask: "How bad is the pain?"  (e.g., Scale 1-10; mild, moderate, or severe)   - MILD (1-3): doesn't interfere with normal activities, abdomen soft and not tender to touch    - MODERATE (4-7): interferes with normal activities or awakens from sleep, abdomen tender to touch    - SEVERE (8-10): excruciating pain, doubled over, unable to do any normal activities       Mild 7. ORAL INTAKE: If vomiting, "Have you been able to drink liquids?" "How much liquids have you had in the past 24 hours?"     N/A 8. HYDRATION: "Any signs of dehydration?" (e.g., dry mouth [not just dry lips], too weak to stand, dizziness, new weight loss) "When did you last urinate?"     Dizziness  9. EXPOSURE: "Have you traveled to a foreign country recently?" "Have you been exposed to anyone with diarrhea?" "Could you have eaten any food that was spoiled?"     No 10. ANTIBIOTIC USE: "Are you taking antibiotics now or have you taken antibiotics in the past 2 months?"       Unsure  11. OTHER SYMPTOMS: "Do you have any other symptoms?" (e.g., fever, blood in stool)       Nausea  Protocols used: Diarrhea-A-AH

## 2023-10-14 ENCOUNTER — Encounter: Payer: Self-pay | Admitting: Family Medicine

## 2023-10-14 ENCOUNTER — Ambulatory Visit: Payer: Self-pay | Admitting: Family Medicine

## 2023-10-14 VITALS — BP 109/75 | HR 78 | Resp 18 | Ht <= 58 in | Wt 99.0 lb

## 2023-10-14 DIAGNOSIS — R197 Diarrhea, unspecified: Secondary | ICD-10-CM

## 2023-10-14 DIAGNOSIS — K529 Noninfective gastroenteritis and colitis, unspecified: Secondary | ICD-10-CM | POA: Diagnosis not present

## 2023-10-14 DIAGNOSIS — R300 Dysuria: Secondary | ICD-10-CM

## 2023-10-14 DIAGNOSIS — R42 Dizziness and giddiness: Secondary | ICD-10-CM

## 2023-10-14 MED ORDER — ONDANSETRON HCL 4 MG PO TABS: 4.0000 mg | ORAL_TABLET | Freq: Three times a day (TID) | ORAL | 0 refills | Status: AC | PRN

## 2023-10-14 MED ORDER — MECLIZINE HCL 12.5 MG PO TABS: 12.5000 mg | ORAL_TABLET | Freq: Three times a day (TID) | ORAL | 0 refills | Status: AC | PRN

## 2023-10-14 NOTE — Patient Instructions (Addendum)
 F/U as before with PCP  You are treated today for gastroenteritis, vertigo and you are being checked for a urinary tract infection  Try to ensure adequate hydration and eat small amounts often  Labs today , CCUA and reflex c/s   Cmp and Egfr,     Please submit stool for c/s and c diff as soon as possible  Ears are not infected and exam is normal  Medication is sent for vertigo, meclizine and for nausea zofran   Thanks for choosing [facility], we consider it a privelige to serve you.

## 2023-10-14 NOTE — Assessment & Plan Note (Signed)
 Burning and discomfort with urination, test for infection

## 2023-10-15 DIAGNOSIS — R197 Diarrhea, unspecified: Secondary | ICD-10-CM | POA: Diagnosis not present

## 2023-10-15 DIAGNOSIS — K529 Noninfective gastroenteritis and colitis, unspecified: Secondary | ICD-10-CM | POA: Diagnosis not present

## 2023-10-15 LAB — UA/M W/RFLX CULTURE, ROUTINE
Bilirubin, UA: NEGATIVE
Glucose, UA: NEGATIVE
Ketones, UA: NEGATIVE
Leukocytes,UA: NEGATIVE
Nitrite, UA: NEGATIVE
RBC, UA: NEGATIVE
Specific Gravity, UA: 1.027 (ref 1.005–1.030)
Urobilinogen, Ur: 0.2 mg/dL (ref 0.2–1.0)
pH, UA: 6 (ref 5.0–7.5)

## 2023-10-15 LAB — CMP14+EGFR
ALT: 21 IU/L (ref 0–32)
AST: 15 IU/L (ref 0–40)
Albumin: 4.5 g/dL (ref 3.9–4.9)
Alkaline Phosphatase: 104 IU/L (ref 44–121)
BUN/Creatinine Ratio: 11 — ABNORMAL LOW (ref 12–28)
BUN: 11 mg/dL (ref 8–27)
Bilirubin Total: 0.4 mg/dL (ref 0.0–1.2)
CO2: 19 mmol/L — ABNORMAL LOW (ref 20–29)
Calcium: 10 mg/dL (ref 8.7–10.3)
Chloride: 102 mmol/L (ref 96–106)
Creatinine, Ser: 0.97 mg/dL (ref 0.57–1.00)
Globulin, Total: 2.9 g/dL (ref 1.5–4.5)
Glucose: 91 mg/dL (ref 70–99)
Potassium: 5.1 mmol/L (ref 3.5–5.2)
Sodium: 140 mmol/L (ref 134–144)
Total Protein: 7.4 g/dL (ref 6.0–8.5)
eGFR: 65 mL/min/{1.73_m2} (ref >59)

## 2023-10-15 LAB — MICROSCOPIC EXAMINATION
Casts: NONE SEEN /LPF
WBC, UA: NONE SEEN /HPF (ref 0–5)

## 2023-10-17 LAB — GI PROFILE, STOOL, PCR

## 2023-10-17 LAB — CLOSTRIDIUM DIFFICILE EIA: C difficile Toxins A+B, EIA: NEGATIVE

## 2023-10-18 ENCOUNTER — Encounter: Payer: Self-pay | Admitting: Family Medicine

## 2023-10-18 ENCOUNTER — Ambulatory Visit: Payer: Self-pay | Admitting: Family Medicine

## 2023-10-18 DIAGNOSIS — R197 Diarrhea, unspecified: Secondary | ICD-10-CM | POA: Insufficient documentation

## 2023-10-18 NOTE — Progress Notes (Signed)
   Jean Perez     MRN: [id number]      DOB: 10/26/1955  Chief Complaint  Patient presents with   Diarrhea    Pt complains of severe diarrhea, mild abdominal cramping, and nausea x10 days. Also complains of dizziness for the last 5 days. Denies fever     HPI Jean Perez is here for acute visit with above complaint No other family member or close associate is similarly affected ROS Denies recent fever or chills. Denies sinus pressure, nasal congestion, ear pain or sore throat. Denies chest congestion, productive cough or wheezing. Denies chest pains, palpitations and leg swelling .   3 day h/o dysuria and frequency, denies flank pain .   PE  BP 109/75   Pulse 78   Resp 18   Ht 4\' 10"  (1.473 m)   Wt 99 lb 0.6 oz (44.9 kg)   LMP  (LMP Unknown)   SpO2 96%   BMI 20.70 kg/m   Patient alert and oriented and in no cardiopulmonary distress.  HEENT: No facial asymmetry, EOMI,     Neck supple .No sinus tenderness, TM clear bilaterally No nystagmus Chest: Clear to auscultation bilaterally.  CVS: S1, S2 no murmurs, no S3.Regular rate.  ABD: Soft diffuse superficial tenderness, no guarding or rebound, hyperactive BS, no distension, no renal angle tenderness  Ext: No edema  MS: Adequate ROM spine, shoulders, hips and knees.  Skin: Intact, no ulcerations or rash noted.  Psych: Good eye contact, normal affect.   CNS: CN 2-12 intact, power,  normal throughout.no focal deficits noted.   Assessment & Plan  Dysuria Burning and discomfort with urination, test for infection   Acute gastroenteritis Symptomatic treatment, zofran  prescribed limited for as needed use Hydration pushed , labs ordered to include stool testing based on duration of symptoms  Vertigo Meclizne prescribed for as needed use short term

## 2023-10-18 NOTE — Assessment & Plan Note (Signed)
 Meclizne prescribed for as needed use short term

## 2023-10-18 NOTE — Assessment & Plan Note (Signed)
 Symptomatic treatment, zofran  prescribed limited for as needed use Hydration pushed , labs ordered to include stool testing based on duration of symptoms

## 2024-01-04 ENCOUNTER — Ambulatory Visit (INDEPENDENT_AMBULATORY_CARE_PROVIDER_SITE_OTHER): Admitting: Internal Medicine

## 2024-01-04 ENCOUNTER — Encounter: Payer: Self-pay | Admitting: Internal Medicine

## 2024-01-04 VITALS — BP 132/82 | HR 91 | Ht <= 58 in | Wt 102.0 lb

## 2024-01-04 DIAGNOSIS — N3281 Overactive bladder: Secondary | ICD-10-CM | POA: Diagnosis not present

## 2024-01-04 DIAGNOSIS — F419 Anxiety disorder, unspecified: Secondary | ICD-10-CM | POA: Diagnosis not present

## 2024-01-04 DIAGNOSIS — E782 Mixed hyperlipidemia: Secondary | ICD-10-CM

## 2024-01-04 DIAGNOSIS — E559 Vitamin D deficiency, unspecified: Secondary | ICD-10-CM

## 2024-01-04 DIAGNOSIS — R739 Hyperglycemia, unspecified: Secondary | ICD-10-CM | POA: Diagnosis not present

## 2024-01-04 DIAGNOSIS — Z23 Encounter for immunization: Secondary | ICD-10-CM

## 2024-01-04 DIAGNOSIS — G90512 Complex regional pain syndrome I of left upper limb: Secondary | ICD-10-CM

## 2024-01-04 NOTE — Patient Instructions (Signed)
 Please continue to take medications as prescribed.  Please continue to follow low carb diet and perform moderate exercise/walking at least 150 mins/week.  Please get fasting blood tests done before the next visit.

## 2024-01-04 NOTE — Assessment & Plan Note (Addendum)
 Lipid profile reviewed - LDL significantly improved compared to prior Crestor  10 mg every other day for now, she prefers to take less medicines Advised to follow low cholesterol diet

## 2024-01-04 NOTE — Assessment & Plan Note (Addendum)
 Currently manageable without medicine Previous UAs have been negative for UTI Had started oxybutynin  10 mg nightly, but she had dry mouth and stopped it If persistent symptoms, can consider Myrbetriq and/or will refer to Urogynecology

## 2024-01-04 NOTE — Assessment & Plan Note (Signed)
 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

## 2024-01-04 NOTE — Progress Notes (Signed)
 Established Patient Office Visit  Subjective:  Patient ID: Jean Perez, female    DOB: 1956/03/26  Age: 68 y.o. MRN: [id number]  CC:  Chief Complaint  Patient presents with   Anxiety    Follow up    overactive bladder    Follow up     HPI Jean Perez is a 68 y.o. 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  She continues to have severe pain over left UE.  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  She has mild numbness over left UE, but strength is intact.  She was given duloxetine  for pain and anxiety, but she did not try it.  Her lipid profile shows improved LDL, better than prior with Crestor , diet modification and exercise.  Overactive bladder: She reports improvement in chronic urinary urgency recently.  She had to use bathroom every 20 minutes or so, has minimal urine output each time. Denies dysuria or hematuria.  She has been evaluated by urology in the past, but she did not opt to take medicine at that time. She has previously undergone urethral dilation by Dr. Lamar Sar in 2002 and 2008. No history of frequent UTIs.  She was given oxybutynin  in the last visit, but had severe dry mouth and has stopped it.   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     Past Surgical History:  Procedure Laterality Date   arm surgery     CARPAL TUNNEL RELEASE     left elbow      Family History  Problem Relation Age of Onset   Glaucoma Paternal Grandfather    Heart disease Father    Hypertension Mother    Breast cancer Maternal Aunt    Stroke Other    Heart disease Other    High blood pressure Other     Social History   Socioeconomic History   Marital  status: Media planner    Spouse name: Not on file   Number of children: 2   Years of education: HS   Highest education level: Not on file  Occupational History    Employer: UNEMPLOYED    Comment: disabled  Tobacco Use   Smoking status: Every Day    Current packs/day: 0.25    Types: Cigarettes   Smokeless tobacco: Never  Vaping Use   Vaping status: Never Used  Substance and Sexual Activity   Alcohol use: Yes    Comment: occ   Drug use: No   Sexual activity: Not Currently    Birth control/protection: Post-menopausal  Other Topics Concern   Not on file  Social History Narrative   Patient is disabled. And she is divorced.    Caffeine one pot of coffee daily.   Left handed.   Social Drivers of Corporate investment banker Strain: Low Risk  (04/11/2023)   Overall Financial Resource Strain (CARDIA)    Difficulty of Paying Living Expenses: Not very hard  Food Insecurity: No Food Insecurity (04/11/2023)   Hunger Vital Sign    Worried About Running Out of Food in the Last Year: Never true    Ran Out of Food in the  Last Year: Never true  Transportation Needs: No Transportation Needs (04/11/2023)   PRAPARE - Administrator, Civil Service (Medical): No    Lack of Transportation (Non-Medical): No  Physical Activity: Sufficiently Active (04/11/2023)   Exercise Vital Sign    Days of Exercise per Week: 5 days    Minutes of Exercise per Session: 60 min  Stress: Stress Concern Present (04/11/2023)   [employer] of Occupational Health - Occupational Stress Questionnaire    Feeling of Stress : Very much  Social Connections: Moderately Isolated (04/11/2023)   Social Connection and Isolation Panel    Frequency of Communication with Friends and Family: Three times a week    Frequency of Social Gatherings with Friends and Family: Once a week    Attends Religious Services: Never    Database administrator or Organizations: No    Attends Banker Meetings:  Never    Marital Status: Living with partner  Intimate Partner Violence: Not At Risk (04/15/2023)   Humiliation, Afraid, Rape, and Kick questionnaire    Fear of Current or Ex-Partner: No    Emotionally Abused: No    Physically Abused: No    Sexually Abused: No    Outpatient Medications Prior to Visit  Medication Sig Dispense Refill   albuterol  (VENTOLIN  HFA) 108 (90 Base) MCG/ACT inhaler Inhale 2 puffs into the lungs every 6 (six) hours as needed for wheezing or shortness of breath. 8 g 1   Calcium  Carbonate-Vit D-Min (CALTRATE 600+D PLUS MINERALS PO) Take by mouth.     rosuvastatin  (CRESTOR ) 10 MG tablet Take 1 tablet (10 mg total) by mouth daily. 90 tablet 1   DULoxetine  (CYMBALTA ) 20 MG capsule Take 1 capsule (20 mg total) by mouth daily. (Patient not taking: Reported on 01/04/2024) 30 capsule 3   meclizine  (ANTIVERT ) 12.5 MG tablet Take 1 tablet (12.5 mg total) by mouth 3 (three) times daily as needed for dizziness. (Patient not taking: Reported on 01/04/2024) 30 tablet 0   ondansetron  (ZOFRAN ) 4 MG tablet Take 1 tablet (4 mg total) by mouth every 8 (eight) hours as needed for nausea or vomiting. (Patient not taking: Reported on 01/04/2024) 20 tablet 0   ondansetron  (ZOFRAN ) 4 MG tablet Take 1 tablet (4 mg total) by mouth every 8 (eight) hours as needed for nausea or vomiting. (Patient not taking: Reported on 01/04/2024) 20 tablet 0   oxybutynin  (DITROPAN -XL) 10 MG 24 hr tablet Take 1 tablet (10 mg total) by mouth at bedtime. (Patient not taking: Reported on 01/04/2024) 30 tablet 3   No facility-administered medications prior to visit.    Allergies  Allergen Reactions   Penicillins Hives, Rash and Other (See Comments)    Has patient had a PCN reaction causing immediate rash, facial/tongue/throat swelling, SOB or lightheadedness with hypotension: Yes Has patient had a PCN reaction causing severe rash involving mucus membranes or skin necrosis: Yes Has patient had a PCN reaction that  required hospitalization No Has patient had a PCN reaction occurring within the last 10 years: No If all of the above answers are NO, then may proceed with Cephalosporin use.   Other Reaction: BRONCHOSPASM    ROS Review of Systems  Constitutional:  Negative for chills and fever.  HENT:  Negative for congestion, sinus pressure, sinus pain and sore throat.   Eyes:  Negative for pain and discharge.  Respiratory:  Negative for cough and shortness of breath.   Cardiovascular:  Negative for chest pain and palpitations.  Gastrointestinal:  Negative for abdominal pain, diarrhea, nausea and vomiting.  Endocrine: Negative for polydipsia and polyuria.  Genitourinary:  Positive for urgency. Negative for dysuria and hematuria.  Musculoskeletal:  Negative for neck pain and neck stiffness.       LUE pain  Skin:  Negative for rash.  Neurological:  Negative for dizziness and weakness.  Psychiatric/Behavioral:  Positive for sleep disturbance. Negative for agitation and behavioral problems. The patient is nervous/anxious.       Objective:    Physical Exam Vitals reviewed.  Constitutional:      General: She is not in acute distress.    Appearance: She is not diaphoretic.  HENT:     Head: Normocephalic and atraumatic.     Nose: Nose normal.     Mouth/Throat:     Mouth: Mucous membranes are moist.  Eyes:     General: No scleral icterus.    Extraocular Movements: Extraocular movements intact.  Cardiovascular:     Rate and Rhythm: Normal rate and regular rhythm.     Heart sounds: Normal heart sounds. No murmur heard. Pulmonary:     Breath sounds: Normal breath sounds. No wheezing or rales.  Musculoskeletal:     Cervical back: Neck supple. No tenderness.     Right lower leg: No edema.     Left lower leg: No edema.  Skin:    General: Skin is warm.     Findings: No rash.  Neurological:     General: No focal deficit present.     Mental Status: She is alert and oriented to person, place,  and time.     Sensory: No sensory deficit.     Motor: No weakness.  Psychiatric:        Mood and Affect: Mood normal.        Behavior: Behavior normal.     BP 132/82 (BP Location: Left Arm)   Pulse 91   Ht 4' 10 (1.473 m)   Wt 102 lb (46.3 kg)   LMP  (LMP Unknown)   SpO2 96%   BMI 21.32 kg/m  Wt Readings from Last 3 Encounters:  01/04/24 102 lb (46.3 kg)  10/14/23 99 lb 0.6 oz (44.9 kg)  09/04/23 100 lb 9.6 oz (45.6 kg)    Lab Results  Component Value Date   TSH 2.140 08/26/2023   Lab Results  Component Value Date   WBC 10.8 08/26/2023   HGB 13.4 08/26/2023   HCT 40.1 08/26/2023   MCV 97 08/26/2023   PLT 297 08/26/2023   Lab Results  Component Value Date   NA 140 10/14/2023   K 5.1 10/14/2023   CO2 19 (L) 10/14/2023   GLUCOSE 91 10/14/2023   BUN 11 10/14/2023   CREATININE 0.97 10/14/2023   BILITOT 0.4 10/14/2023   ALKPHOS 104 10/14/2023   AST 15 10/14/2023   ALT 21 10/14/2023   PROT 7.4 10/14/2023   ALBUMIN 4.5 10/14/2023   CALCIUM  10.0 10/14/2023   ANIONGAP 7 02/15/2016   EGFR 65 10/14/2023   Lab Results  Component Value Date   CHOL 111 08/26/2023   Lab Results  Component Value Date   HDL 48 08/26/2023   Lab Results  Component Value Date   LDLCALC 49 08/26/2023   Lab Results  Component Value Date   TRIG 65 08/26/2023   Lab Results  Component Value Date   CHOLHDL 2.3 08/26/2023   Lab Results  Component Value Date   HGBA1C 5.8 (H) 08/26/2023  Assessment & Plan:   Problem List Items Addressed This Visit       Nervous and Auditory   Complex regional pain syndrome of left upper extremity   Was well-controlled with Gabapentin , but was having dizziness, fatigue and anxiety with it, has stopped taking it Has had stellate cervical blocks in the past, does not see Neurology now Had started Cymbalta  20 mg daily, plan to increase the dose as tolerated -Cymbalta  can help with MDD and anxiety as well, but she did not continue it, agrees  to try it now      Relevant Orders   TSH   CMP14+EGFR   CBC with Differential/Platelet     Genitourinary   Overactive bladder   Currently manageable without medicine Previous UAs have been negative for UTI Had started oxybutynin  10 mg nightly, but she had dry mouth and stopped it If persistent symptoms, can consider Myrbetriq and/or will refer to Urogynecology        Other   Anxiety   Was on Lexapro  Has chronic anxiety Had started Cymbalta  for neuropathic pain, can also help with GAD - needs to start taking it      Relevant Orders   TSH   Vitamin D  deficiency   Checked Vitamin D  level Continue Vitamin D  2000 IU QD      Relevant Orders   VITAMIN D  25 Hydroxy (Vit-D Deficiency, Fractures)   Mixed hyperlipidemia - Primary   Lipid profile reviewed - LDL significantly improved compared to prior Crestor  10 mg every other day for now, she prefers to take less medicines Advised to follow low cholesterol diet      Relevant Orders   Lipid panel   Other Visit Diagnoses       Hyperglycemia       Relevant Orders   Hemoglobin A1c     Encounter for immunization       Relevant Orders   Pneumococcal conjugate vaccine 20-valent (Completed)           No orders of the defined types were placed in this encounter.   Follow-up: Return in about 6 months (around 07/06/2024) for GAD.    Suzzane MARLA Blanch, MD

## 2024-01-04 NOTE — Assessment & Plan Note (Signed)
 Was on Lexapro  Has chronic anxiety Had started Cymbalta  for neuropathic pain, can also help with GAD - needs to start taking it

## 2024-01-04 NOTE — Assessment & Plan Note (Addendum)
 Checked Vitamin D  level Continue Vitamin D  2000 IU QD

## 2024-01-26 DIAGNOSIS — H40059 Ocular hypertension, unspecified eye: Secondary | ICD-10-CM | POA: Diagnosis not present

## 2024-01-28 DIAGNOSIS — Z23 Encounter for immunization: Secondary | ICD-10-CM | POA: Diagnosis not present

## 2024-04-19 ENCOUNTER — Ambulatory Visit

## 2024-04-19 VITALS — Ht <= 58 in | Wt 101.0 lb

## 2024-04-19 DIAGNOSIS — Z0000 Encounter for general adult medical examination without abnormal findings: Secondary | ICD-10-CM

## 2024-04-19 NOTE — Progress Notes (Signed)
 Chief Complaint  Patient presents with   Medicare Wellness     Subjective:   Jean Perez is a 68 y.o. female who presents for a Medicare Annual Wellness Visit.  Visit info / Clinical Intake: Medicare Wellness Visit Type:: Subsequent Annual Wellness Visit Persons participating in visit and providing information:: patient Medicare Wellness Visit Mode:: Telephone If telephone:: video error Since this visit was completed virtually, some vitals may be partially provided or unavailable. Missing vitals are due to the limitations of the virtual format.: Documented vitals are patient reported If Telephone or Video please confirm:: I connected with patient using audio/video enable telemedicine. I verified patient identity with two identifiers, discussed telehealth limitations, and patient agreed to proceed. Patient Location:: home Provider Location:: home office Interpreter Needed?: No Pre-visit prep was completed: yes AWV questionnaire completed by patient prior to visit?: no Living arrangements:: lives with spouse/significant other Patient's Overall Health Status Rating: very good Typical amount of pain: none Does pain affect daily life?: no Are you currently prescribed opioids?: no  Dietary Habits and Nutritional Risks How many meals a day?: 3 Eats fruit and vegetables daily?: yes Most meals are obtained by: preparing own meals In the last 2 weeks, have you had any of the following?: none Diabetic:: no  Functional Status Activities of Daily Living (to include ambulation/medication): Independent Ambulation: Independent Medication Administration: Independent Home Management (perform basic housework or laundry): Independent Manage your own finances?: yes Primary transportation is: driving Concerns about vision?: no *vision screening is required for WTM* Concerns about hearing?: no  Fall Screening Falls in the past year?: 0 Number of falls in past year: 0 Was there an injury  with Fall?: 0 Fall Risk Category Calculator: 0 Patient Fall Risk Level: Low Fall Risk  Fall Risk Patient at Risk for Falls Due to: No Fall Risks Fall risk Follow up: Falls evaluation completed; Education provided; Falls prevention discussed  Home and Transportation Safety: All rugs have non-skid backing?: N/A, no rugs All stairs or steps have railings?: yes Grab bars in the bathtub or shower?: (!) no Have non-skid surface in bathtub or shower?: yes Good home lighting?: yes Regular seat belt use?: yes Hospital stays in the last year:: no  Cognitive Assessment Difficulty concentrating, remembering, or making decisions? : no Will 6CIT or Mini Cog be Completed: no 6CIT or Mini Cog Declined: patient alert, oriented, able to answer questions appropriately and recall recent events  Advance Directives (For Healthcare) Does Patient Have a Medical Advance Directive?: No Would patient like information on creating a medical advance directive?: No - Patient declined  Reviewed/Updated  Reviewed/Updated: Reviewed All (Medical, Surgical, Family, Medications, Allergies, Care Teams, Patient Goals)    Allergies (verified) Penicillins   Current Medications (verified) Outpatient Encounter Medications as of 04/19/2024  Medication Sig   Calcium  Carbonate-Vit D-Min (CALTRATE 600+D PLUS MINERALS PO) Take by mouth.   DULoxetine  (CYMBALTA ) 20 MG capsule Take 1 capsule (20 mg total) by mouth daily.   rosuvastatin  (CRESTOR ) 10 MG tablet Take 1 tablet (10 mg total) by mouth daily.   Sodium Fluoride 1.1 % PSTE Place 1 application  onto teeth in the morning and at bedtime.   albuterol  (VENTOLIN  HFA) 108 (90 Base) MCG/ACT inhaler Inhale 2 puffs into the lungs every 6 (six) hours as needed for wheezing or shortness of breath.   meclizine  (ANTIVERT ) 12.5 MG tablet Take 1 tablet (12.5 mg total) by mouth 3 (three) times daily as needed for dizziness. (Patient not taking: Reported on 01/04/2024)  ondansetron   (ZOFRAN ) 4 MG tablet Take 1 tablet (4 mg total) by mouth every 8 (eight) hours as needed for nausea or vomiting. (Patient not taking: Reported on 01/04/2024)   No facility-administered encounter medications on file as of 04/19/2024.    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    Past Surgical History:  Procedure Laterality Date   arm surgery     CARPAL TUNNEL RELEASE     FRACTURE SURGERY     Left elbow, left ankle   left elbow     Family History  Problem Relation Age of Onset   Glaucoma Paternal Grandfather    Heart disease Father    Hypertension Mother    Breast cancer Maternal Aunt    Stroke Other    Heart disease Other    High blood pressure Other    Social History   Occupational History    Employer: UNEMPLOYED    Comment: disabled  Tobacco Use   Smoking status: Every Day    Current packs/day: 0.25    Average packs/day: 0.3 packs/day for 15.9 years (4.0 ttl pk-yrs)    Types: Cigarettes    Start date: 2010   Smokeless tobacco: Never  Vaping Use   Vaping status: Never Used  Substance and Sexual Activity   Alcohol use: Yes    Comment: occ   Drug use: No   Sexual activity: Not Currently    Birth control/protection: Post-menopausal   Tobacco Counseling Ready to quit: Yes Counseling given: Yes  SDOH Screenings   Food Insecurity: No Food Insecurity (04/19/2024)  Housing: Low Risk  (04/19/2024)  Transportation Needs: No Transportation Needs (04/19/2024)  Utilities: Not At Risk (04/19/2024)  Alcohol Screen: Low Risk  (04/11/2023)  Depression (PHQ2-9): Low Risk  (04/19/2024)  Financial Resource Strain: Low Risk  (04/11/2023)  Physical Activity: Sufficiently Active (04/19/2024)  Social Connections: Moderately Isolated (04/19/2024)  Stress: Stress Concern Present  (04/19/2024)  Tobacco Use: High Risk (04/19/2024)  Health Literacy: Adequate Health Literacy (04/19/2024)   See flowsheets for full screening details  Depression Screen PHQ 2 & 9 Depression Scale- Over the past 2 weeks, how often have you been bothered by any of the following problems? Little interest or pleasure in doing things: 0 Feeling down, depressed, or hopeless (PHQ Adolescent also includes...irritable): 0 PHQ-2 Total Score: 0 Trouble falling or staying asleep, or sleeping too much: 0 Feeling tired or having little energy: 0 Poor appetite or overeating (PHQ Adolescent also includes...weight loss): 0 Feeling bad about yourself - or that you are a failure or have let yourself or your family down: 0 Trouble concentrating on things, such as reading the newspaper or watching television (PHQ Adolescent also includes...like school work): 0 Moving or speaking so slowly that other people could have noticed. Or the opposite - being so fidgety or restless that you have been moving around a lot more than usual: 0 Thoughts that you would be better off dead, or of hurting yourself in some way: 0 PHQ-9 Total Score: 0 If you checked off any problems, how difficult have these problems made it for you to do your work, take care of things at home, or get along with other people?: Not difficult at all  Depression Treatment Depression Interventions/Treatment : PHQ2-9 Score <4 Follow-up Not Indicated     Goals Addressed               This Visit's Progress     I want to keep up the new walking regimen I've established (pt-stated)               Objective:    Today's Vitals   04/19/24 1001  Weight: 101 lb (45.8 kg)  Height: 4' 10 (1.473 m)   Body mass index is 21.11 kg/m.  Hearing/Vision screen Hearing Screening - Comments:: Patient states she does have some difficulty hearing. She defers referral today. She will call office when she is ready for a referral.  Vision Screening -  Comments:: Wears rx glasses - up to date with routine eye exams with  Oneil Kawasaki w/ [facility] location Immunizations and Health Maintenance Health Maintenance  Topic Date Due   COVID-19 Vaccine (5 - 2025-26 season) 01/11/2024   Medicare Annual Wellness (AWV)  04/14/2024   Mammogram  07/16/2024   Bone Density Scan  07/16/2025   Fecal DNA (Cologuard)  12/01/2025   DTaP/Tdap/Td (2 - Td or Tdap) 12/17/2031   Pneumococcal Vaccine: 50+ Years  Completed   Influenza Vaccine  Completed   Hepatitis C Screening  Completed   Zoster Vaccines- Shingrix  Completed   Meningococcal B Vaccine  Aged Out        Assessment/Plan:  This is a routine wellness examination for Jean Perez.  Patient Care Team: Tobie Suzzane POUR, MD as PCP - General (Internal Medicine) Nada Macintosh, FNP (Family Medicine) Kristie Lamprey, MD as Consulting Physician (Gastroenterology) Kawasaki Oneil, DO (Optometry) Marilynn Nest, DO as Consulting Physician (Obstetrics and Gynecology)  I have personally reviewed and noted the following in the patient's chart:   Medical and social history Use of alcohol, tobacco or illicit drugs  Current medications and supplements including opioid prescriptions. Functional ability and status Nutritional status Physical activity Advanced directives List of other physicians Hospitalizations, surgeries, and ER visits in previous 12 months Vitals Screenings to include cognitive, depression, and falls Referrals and appointments  No orders of the defined types were placed in this encounter.  In addition, I have reviewed and discussed with patient certain preventive protocols, quality metrics, and best practice recommendations. A written personalized care plan for preventive services as well as general preventive health recommendations were provided to patient.   Saylee Sherrill, CMA   04/19/2024   No follow-ups on file.  After Visit Summary: (Mail) Due to this being a telephonic  visit, the after visit summary with patients personalized plan was offered to patient via mail   Nurse Notes: declined smoking cessation referral

## 2024-04-19 NOTE — Patient Instructions (Addendum)
 [name],  Thank you for taking the time for your Medicare Wellness Visit. I appreciate your continued commitment to your health goals. Please review the care plan we discussed, and feel free to reach out if I can assist you further.  Please note that Annual Wellness Visits do not include a physical exam. Some assessments may be limited, especially if the visit was conducted virtually. If needed, we may recommend an in-person follow-up with your provider.  Today we discussed finding something to occupy your hands such as crocheting to help you stop smoking. You can get a skein of yarn and a crochet hook at [employer] for less than $4. Look up Crochet with Estefana (formerly [phone]) and The Crochet Crowd with [employer] on YouTube for excellent beginner videos!!  Ongoing Care Seeing your primary care provider every 3 to 6 months helps us  monitor your health and provide consistent, personalized care.   Next office visit with primary care provider: July 04, 2024 at 8:00 am   1 year follow up for Medicare well visit: April 21, 2025 at 8:00am with medicare wellness nurse in office  Referrals If a referral was made during today's visit and you haven't received any updates within two weeks, please contact the referred provider directly to check on the status.   Recommended Screenings:  Health Maintenance  Topic Date Due   COVID-19 Vaccine (5 - 2025-26 season) 01/11/2024   Medicare Annual Wellness Visit  04/14/2024   Breast Cancer Screening  07/16/2024   Osteoporosis screening with Bone Density Scan  07/16/2025   Cologuard (Stool DNA test)  12/01/2025   DTaP/Tdap/Td vaccine (2 - Td or Tdap) 12/17/2031   Pneumococcal Vaccine for age over 76  Completed   Flu Shot  Completed   Hepatitis C Screening  Completed   Zoster (Shingles) Vaccine  Completed   Meningitis B Vaccine  Aged Out       04/19/2024   10:23 AM  Advanced Directives  Does Patient Have a Medical Advance  Directive? No  Would patient like information on creating a medical advance directive? No - Patient declined    Vision: Annual vision screenings are recommended for early detection of glaucoma, cataracts, and diabetic retinopathy. These exams can also reveal signs of chronic conditions such as diabetes and high blood pressure.  Dental: Annual dental screenings help detect early signs of oral cancer, gum disease, and other conditions linked to overall health, including heart disease and diabetes.  Please see the attached documents for additional preventive care recommendations.

## 2024-07-04 ENCOUNTER — Ambulatory Visit: Admitting: Internal Medicine

## 2025-04-21 ENCOUNTER — Ambulatory Visit
